# Patient Record
Sex: Female | Born: 1960 | Hispanic: Yes | Smoking: Never smoker
Health system: Southern US, Community
[De-identification: ages and names within clinical notes are randomized; demographics above are authoritative.]

## PROBLEM LIST (undated history)

## (undated) DIAGNOSIS — F329 Major depressive disorder, single episode, unspecified: Secondary | ICD-10-CM

## (undated) DIAGNOSIS — I10 Essential (primary) hypertension: Secondary | ICD-10-CM

## (undated) DIAGNOSIS — F32A Depression, unspecified: Secondary | ICD-10-CM

## (undated) DIAGNOSIS — T4145XA Adverse effect of unspecified anesthetic, initial encounter: Secondary | ICD-10-CM

## (undated) DIAGNOSIS — T8859XA Other complications of anesthesia, initial encounter: Secondary | ICD-10-CM

## (undated) DIAGNOSIS — N950 Postmenopausal bleeding: Secondary | ICD-10-CM

## (undated) DIAGNOSIS — K2970 Gastritis, unspecified, without bleeding: Secondary | ICD-10-CM

## (undated) HISTORY — DX: Gastritis, unspecified, without bleeding: K29.70

---
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
## 1998-10-31 ENCOUNTER — Emergency Department (HOSPITAL_COMMUNITY): Admission: EM | Admit: 1998-10-31 | Discharge: 1998-10-31 | Payer: Self-pay

## 1999-04-23 ENCOUNTER — Emergency Department (HOSPITAL_COMMUNITY): Admission: EM | Admit: 1999-04-23 | Discharge: 1999-04-23 | Payer: Self-pay | Admitting: Emergency Medicine

## 1999-05-03 ENCOUNTER — Emergency Department (HOSPITAL_COMMUNITY): Admission: EM | Admit: 1999-05-03 | Discharge: 1999-05-03 | Payer: Self-pay | Admitting: Emergency Medicine

## 1999-05-06 ENCOUNTER — Ambulatory Visit (HOSPITAL_COMMUNITY): Admission: RE | Admit: 1999-05-06 | Discharge: 1999-05-06 | Payer: Self-pay | Admitting: Emergency Medicine

## 1999-05-06 ENCOUNTER — Encounter: Payer: Self-pay | Admitting: Emergency Medicine

## 1999-05-19 ENCOUNTER — Ambulatory Visit (HOSPITAL_COMMUNITY): Admission: RE | Admit: 1999-05-19 | Discharge: 1999-05-19 | Payer: Self-pay | Admitting: Family Medicine

## 2001-01-02 ENCOUNTER — Emergency Department (HOSPITAL_COMMUNITY): Admission: EM | Admit: 2001-01-02 | Discharge: 2001-01-02 | Payer: Self-pay | Admitting: Emergency Medicine

## 2001-01-02 ENCOUNTER — Encounter: Payer: Self-pay | Admitting: Emergency Medicine

## 2004-01-24 ENCOUNTER — Emergency Department (HOSPITAL_COMMUNITY): Admission: EM | Admit: 2004-01-24 | Discharge: 2004-01-24 | Payer: Self-pay | Admitting: Emergency Medicine

## 2004-01-27 ENCOUNTER — Emergency Department (HOSPITAL_COMMUNITY): Admission: EM | Admit: 2004-01-27 | Discharge: 2004-01-27 | Payer: Self-pay | Admitting: Emergency Medicine

## 2010-02-19 ENCOUNTER — Ambulatory Visit (HOSPITAL_COMMUNITY): Admission: RE | Admit: 2010-02-19 | Discharge: 2010-02-19 | Payer: Self-pay | Admitting: Geriatric Medicine

## 2010-02-23 ENCOUNTER — Inpatient Hospital Stay (HOSPITAL_COMMUNITY): Admission: AD | Admit: 2010-02-23 | Discharge: 2010-02-23 | Payer: Self-pay | Admitting: Obstetrics and Gynecology

## 2011-02-14 ENCOUNTER — Emergency Department (HOSPITAL_COMMUNITY)
Admission: EM | Admit: 2011-02-14 | Discharge: 2011-02-15 | Disposition: A | Discharge status: Home / Self Care | Payer: No Typology Code available for payment source | Attending: Emergency Medicine | Admitting: Emergency Medicine

## 2011-02-14 ENCOUNTER — Other Ambulatory Visit (HOSPITAL_COMMUNITY): Payer: Self-pay | Admitting: Specialist

## 2011-02-14 DIAGNOSIS — M545 Low back pain, unspecified: Secondary | ICD-10-CM | POA: Insufficient documentation

## 2011-02-14 DIAGNOSIS — Z1231 Encounter for screening mammogram for malignant neoplasm of breast: Secondary | ICD-10-CM

## 2011-02-14 DIAGNOSIS — S8010XA Contusion of unspecified lower leg, initial encounter: Secondary | ICD-10-CM | POA: Insufficient documentation

## 2011-02-14 DIAGNOSIS — S335XXA Sprain of ligaments of lumbar spine, initial encounter: Secondary | ICD-10-CM | POA: Insufficient documentation

## 2011-02-14 DIAGNOSIS — M79609 Pain in unspecified limb: Secondary | ICD-10-CM | POA: Insufficient documentation

## 2011-02-15 ENCOUNTER — Emergency Department (HOSPITAL_COMMUNITY): Payer: No Typology Code available for payment source

## 2011-03-08 ENCOUNTER — Ambulatory Visit (HOSPITAL_COMMUNITY)
Admission: RE | Admit: 2011-03-08 | Discharge: 2011-03-08 | Disposition: A | Discharge status: Home / Self Care | Payer: Self-pay | Source: Ambulatory Visit | Attending: Specialist | Admitting: Specialist

## 2011-03-08 DIAGNOSIS — Z1231 Encounter for screening mammogram for malignant neoplasm of breast: Secondary | ICD-10-CM | POA: Insufficient documentation

## 2014-03-29 ENCOUNTER — Ambulatory Visit: Payer: Self-pay | Admitting: Internal Medicine

## 2014-03-29 VITALS — BP 100/80 | HR 82 | Temp 98.7°F | Resp 15 | Ht <= 58 in | Wt 150.0 lb

## 2014-03-29 DIAGNOSIS — R05 Cough: Secondary | ICD-10-CM

## 2014-03-29 DIAGNOSIS — R509 Fever, unspecified: Secondary | ICD-10-CM

## 2014-03-29 DIAGNOSIS — R059 Cough, unspecified: Secondary | ICD-10-CM

## 2014-03-29 DIAGNOSIS — Z833 Family history of diabetes mellitus: Secondary | ICD-10-CM

## 2014-03-29 LAB — POCT CBC
GRANULOCYTE PERCENT: 74 % (ref 37–80)
HCT, POC: 39.4 % (ref 37.7–47.9)
Hemoglobin: 12.4 g/dL (ref 12.2–16.2)
Lymph, poc: 1.4 (ref 0.6–3.4)
MCH, POC: 27.1 pg (ref 27–31.2)
MCHC: 31.5 g/dL — AB (ref 31.8–35.4)
MCV: 86.3 fL (ref 80–97)
MID (CBC): 0.5 (ref 0–0.9)
MPV: 11.2 fL (ref 0–99.8)
PLATELET COUNT, POC: 216 10*3/uL (ref 142–424)
POC GRANULOCYTE: 5.5 (ref 2–6.9)
POC LYMPH %: 19 % (ref 10–50)
POC MID %: 7 %M (ref 0–12)
RBC: 4.57 M/uL (ref 4.04–5.48)
RDW, POC: 14.4 %
WBC: 7.4 10*3/uL (ref 4.6–10.2)

## 2014-03-29 LAB — GLUCOSE, POCT (MANUAL RESULT ENTRY): POC GLUCOSE: 123 mg/dL — AB (ref 70–99)

## 2014-03-29 LAB — POCT GLYCOSYLATED HEMOGLOBIN (HGB A1C): HEMOGLOBIN A1C: 6

## 2014-03-29 MED ORDER — AZITHROMYCIN 250 MG PO TABS
ORAL_TABLET | ORAL | Status: DC
Start: 2014-03-29 — End: 2015-01-29

## 2014-03-29 MED ORDER — HYDROCODONE-HOMATROPINE 5-1.5 MG/5ML PO SYRP: 5.0000 mL | ORAL_SOLUTION | Freq: Four times a day (QID) | ORAL | Status: DC | PRN

## 2014-03-29 NOTE — Progress Notes (Signed)
Subjective:    Patient ID: Allison Hubbard, female    DOB: 12/26/1960, 53 y.o.   MRN: [id number]  This chart was scribed for Allison Siaobert Yarima Penman, MD by Bronson CurbJacqueline Melvin, ED Scribe. This patient was seen in room Room/bed 2 and the patient's care was started at 2:03 PM.   HPI  HPI Comments: Allison Hubbard is a 53 y.o. female who presents to the [facility] complaining of constant fever (temp at triage 98.7 F) onset 2 days ago. There is associated cough, congestion, rhinorrhea, right ear pain, sore throat, "pain in her left lung when breathing". Patient has family history of DM. She denies history of CAD.  Patient is requesting labs to be performed to r/o dm.  Review of Systems  Constitutional: Positive for fever.  HENT: Positive for congestion, ear pain, rhinorrhea and sore throat.   Respiratory: Positive for cough.   All other systems reviewed and are negative.      Objective:   Physical Exam  Nursing note and vitals reviewed. Constitutional: She is oriented to person, place, and time. She appears well-developed and well-nourished. No distress.  HENT:  Head: Normocephalic and atraumatic.  Right Ear: External ear normal.  Left Ear: External ear normal.  Nose: Nose normal.  Mouth/Throat: Oropharynx is clear and moist.  Eyes: Conjunctivae and EOM are normal. Pupils are equal, round, and reactive to light.  Neck: Neck supple.  Cardiovascular: Normal rate and regular rhythm.   Pulmonary/Chest: Effort normal. No respiratory distress.  Rhonchi bilaterally.  Musculoskeletal: Normal range of motion.  Lymphadenopathy:    She has no cervical adenopathy.  Neurological: She is alert and oriented to person, place, and time.  Skin: Skin is warm and dry.  Psychiatric: She has a normal mood and affect. Her behavior is normal.          Assessment & Plan:    I have completed the patient encounter in its entirety as documented by the scribe, with editing by  me where necessary. Mustapha Colson P. Merla Richesoolittle, M.D.  Cough--LRI-?mycoplasma  Fever, unspecified  Family history of diabetes mellitus (DM) - Plan: POCT CBC, POCT glycosylated hemoglobin (Hb A1C), POCT glucose (manual entry)  Results for orders placed in visit on 03/29/14  POCT CBC      Result Value Ref Range   WBC 7.4  4.6 - 10.2 K/uL   Lymph, poc 1.4  0.6 - 3.4   POC LYMPH PERCENT 19.0  10 - 50 %L   MID (cbc) 0.5  0 - 0.9   POC MID % 7.0  0 - 12 %M   POC Granulocyte 5.5  2 - 6.9   Granulocyte percent 74.0  37 - 80 %G   RBC 4.57  4.04 - 5.48 M/uL   Hemoglobin 12.4  12.2 - 16.2 g/dL   HCT, POC 40.939.4  81.137.7 - 47.9 %   MCV 86.3  80 - 97 fL   MCH, POC 27.1  27 - 31.2 pg   MCHC 31.5 (*) 31.8 - 35.4 g/dL   RDW, POC 91.414.4     Platelet Count, POC 216  142 - 424 K/uL   MPV 11.2  0 - 99.8 fL  POCT GLYCOSYLATED HEMOGLOBIN (HGB A1C)      Result Value Ref Range   Hemoglobin A1C 6.0    GLUCOSE, POCT (MANUAL RESULT ENTRY)      Result Value Ref Range   POC Glucose 123 (*) 70 - 99 mg/dl   Discussed wt loss  as way to avoid progression to DM Meds ordered this encounter  Medications  . HYDROcodone-homatropine (HYCODAN) 5-1.5 MG/5ML syrup    Sig: Take 5 mLs by mouth every 6 (six) hours as needed for cough.    Dispense:  120 mL    Refill:  0  . azithromycin (ZITHROMAX) 250 MG tablet    Sig: As packaged    Dispense:  6 tablet    Refill:  0   Advise followup at 104 for complete physical examination for age

## 2014-07-28 ENCOUNTER — Other Ambulatory Visit (HOSPITAL_COMMUNITY)
Admission: RE | Admit: 2014-07-28 | Discharge: 2014-07-28 | Disposition: A | Discharge status: Home / Self Care | Payer: No Typology Code available for payment source | Source: Ambulatory Visit | Attending: Gynecology | Admitting: Gynecology

## 2014-07-28 ENCOUNTER — Encounter: Payer: Self-pay | Admitting: Gynecology

## 2014-07-28 ENCOUNTER — Ambulatory Visit (INDEPENDENT_AMBULATORY_CARE_PROVIDER_SITE_OTHER): Payer: Self-pay | Admitting: Gynecology

## 2014-07-28 VITALS — BP 128/76 | Ht <= 58 in | Wt 141.0 lb

## 2014-07-28 DIAGNOSIS — N949 Unspecified condition associated with female genital organs and menstrual cycle: Secondary | ICD-10-CM

## 2014-07-28 DIAGNOSIS — N938 Other specified abnormal uterine and vaginal bleeding: Secondary | ICD-10-CM

## 2014-07-28 DIAGNOSIS — N951 Menopausal and female climacteric states: Secondary | ICD-10-CM

## 2014-07-28 DIAGNOSIS — N915 Oligomenorrhea, unspecified: Secondary | ICD-10-CM

## 2014-07-28 DIAGNOSIS — E663 Overweight: Secondary | ICD-10-CM

## 2014-07-28 DIAGNOSIS — Z124 Encounter for screening for malignant neoplasm of cervix: Secondary | ICD-10-CM

## 2014-07-28 DIAGNOSIS — Z01419 Encounter for gynecological examination (general) (routine) without abnormal findings: Secondary | ICD-10-CM | POA: Insufficient documentation

## 2014-07-28 DIAGNOSIS — Z1151 Encounter for screening for human papillomavirus (HPV): Secondary | ICD-10-CM | POA: Insufficient documentation

## 2014-07-28 MED ORDER — MEDROXYPROGESTERONE ACETATE 10 MG PO TABS: ORAL_TABLET | ORAL | Status: DC

## 2014-07-28 NOTE — Patient Instructions (Addendum)
Medroxyprogesterone tablets Qu es este medicamento? La MEDROXIPROGESTERONA es una hormona de la clase llamada progestinas. Se utiliza comnmente para Human resources officer excesivo del revestimiento uterino en las mujeres tomando un estrgeno despus de la menopausia. Adems se [place] para tratar el sangrado menstrual irregular o la ausencia de sangrado menstrual en mujeres. Este medicamento puede ser utilizado para otros usos; si tiene alguna pregunta consulte con su proveedor de atencin mdica o con su farmacutico. MARCAS COMERCIALES DISPONIBLES: Amen, Provera Qu le debo informar a mi profesional de la salud antes de tomar este medicamento? Necesita saber si usted presenta alguno de los siguientes problemas o situaciones: -enfermedad vascular o antecedente de cogulos sanguneos en los pulmones o las piernas -cncer de mama, cervical o vaginal -enfermedad cardiaca -enfermedad renal -enfermedad heptica -aborto espontneo o aborto reciente -depresin mental -migraa -convulsiones -derrame cerebral -sangrado vaginal que no ha sido evaluado -una Automotive engineer o inusual a la medroxiprogesterona, a otros medicamentos, alimentos, colorantes o conservantes -si est embarazada o buscando quedar embarazada -si est amamantando a un beb Cmo debo utilizar este medicamento? Tome este medicamento por va oral con un vaso de agua. Siga las instrucciones de la etiqueta del [place]. Tome sus dosis a intervalos regulares. No tome su medicamento con una frecuencia mayor a la indicada. Hable con su pediatra para informarse acerca del uso de este medicamento en nios. Puede requerir atencin especial. Aunque este medicamento ha sido recetado a nios tan menores como de 13 aos de edad para condiciones selectivas, las precauciones se aplican. Sobredosis: Pngase en contacto inmediatamente con un centro toxicolgico o una sala de urgencia si usted cree que haya tomado demasiado  medicamento. ATENCIN: [employer] es solo para usted. No comparta este medicamento con nadie. Qu sucede si me olvido de una dosis? Si olvida una dosis, tmela lo antes posible. Si es casi la hora de la prxima dosis, tome slo esa dosis. No tome dosis adicionales o dobles. Qu puede interactuar con este medicamento? -barbitricos para inducir el sueo o para tratar los convulsiones -bosentano -carbamazepina -fenitona -rifampicina -hierba de [place] Puede ser que esta lista no menciona todas las posibles interacciones. Informe a su profesional de [employer] de [employer] Inc productos a base de hierbas, medicamentos de [place] o suplementos nutritivos que est tomando. Si usted fuma, consume bebidas alcohlicas o si utiliza drogas ilegales, indqueselo tambin a su profesional de [employer]. Algunas sustancias pueden interactuar con su medicamento. A qu debo estar atento al usar PPL Corporation? Visite a su profesional de la salud para Insurance underwriter. Deber hacerse exmenes de las mamas y la pelvis en forma regular. Si piensa que est embarazada deje de tomar este medicamento de inmediato y comunquese con su mdico o con su profesional de Radiographer, therapeutic. Qu efectos secundarios puedo tener al [employer] este medicamento? Efectos secundarios que debe informar a su mdico o a Producer, television/film/video de la salud tan pronto como sea posible: -secreciones o sensibilidad de las mamas -cambios de estados de nimo o emociones, tal como depresin -cambios en la visin o el habla -dolor en el abdomen, pecho, entrepierna o piernas -dolor de cabeza severo -erupcin cutnea, picazn o urticarias -falta de aliento repentina -cansancio o debilidad inusual -color amarillento de ojos o piel Efectos secundarios que, por lo general, no requieren atencin mdica (debe informarlos a su mdico o a su profesional de la salud si persisten o si son molestos): -acn -cambios en el sangrado  vaginal -cambios en el deseo sexual -crecimiento  de vello en el rostro -retencin de lquidos e hinchazn -dolor de cabeza -Programme researcher, broadcasting/film/video -aumento o prdida de peso Puede ser que esta lista no menciona todos los posibles efectos secundarios. Comunquese a su mdico por asesoramiento mdico [employer]. Usted puede informar los efectos secundarios a la FDA por telfono al 1-800-FDA-1088. Dnde debo guardar mi medicina? Mantngala fuera del alcance de los nios. Gurdela a San[employer]I, entre 20 y 25 grados C (80 y 36 grados F). Deseche todo medicamento sin utilizar despus de su fecha de vencimiento. ATENCIN: Este folleto es un resumen. Puede ser que no cubra toda la posible informacin. Si usted tiene preguntas acerca de esta medicina, consulte con su mdico, su farmacutico o su profesional de Radiographer, therapeutic.  2015, Elsevier/Gold Standard. (2008-12-19 16:51:24) Biopsia de endometrio (Endometrial Biopsy) La biopsia de endometrio es un procedimiento en el que se toma una muestra de tejido del tero. Luego la muestra de tejido se observa en el microscopio para ver si el tejido es normal o anormal. El endometrio es el revestimiento interno del tero. Este procedimiento ayuda a determinar si est en el ciclo menstrual y de que modo los niveles de hormonas afectan el revestimiento del tero. Este procedimiento tambin se [place] para evaluar el sangrado uterino o para Arts administrator de endometrio, tuberculosis, plipos o enfermedades inflamatorias.  INFORME A SU MDICO:  Cualquier alergia que tenga.  Todos los [employer] [place], incluyendo vitaminas, hierbas, gotas oftlmicas, cremas y [address] de [address].  Problemas previos que usted o los [employer] de su familia hayan tenido con el uso de anestsicos.  Enfermedades de [employer].  Cirugas previas.  Padecimientos mdicos.  Posible embarazo. RIESGOS Y COMPLICACIONES Generalmente es un  procedimiento seguro. Sin embargo, Tree surgeon procedimiento, pueden surgir complicaciones. Las complicaciones posibles son:  Hemorragias.  Infecciones plvicas  Lesin en la pared del tero con el instrumento utilizado para tomar la biopsia (raro). ANTES DEL PROCEDIMIENTO   Lleve un registro de sus ciclos menstruales segn las indicaciones de su mdico. Puede ser necesario que programe el procedimiento para un momento especfico del ciclo menstrual.  Tendr que llevar un apsito sanitario para usar despus del procedimiento.  Pdale a alguna persona que la lleve a su casa despus del procedimiento si le dan un medicamento para relajarse (sedante). PROCEDIMIENTO   Le podrn administrar un medicamento para relajarse.  Deber recostarse en una camilla con los pies y las piernas elevados, como en el examen plvico.  El mdico insertar un instrumento (espculo) en la vagina para observar el cuello del tero.  El cuello del tero ser desinfectado con una solucin antisptica. Para adormecer el cuello del tero le aplicarn un medicamento (anestsico local).  Se utilizar un frceps (tenculo) para Radio producer KB Home	[place].  Se insertar un instrumento delgado, similar a una varilla (sonda uterina) a travs del cuello del tero para Chief Strategy Officer su longitud y la ubicacin en la que ser tomada la muestra para la biopsia.  Luego se pasa un tubo delgado y flexible (catter) a travs del cuello del tero hasta el tero. El catter se [place] para Building control surveyor de tejido del endometrio para la biopsia.  El catter y el espculo se retirarn y la [place] se enviar al laboratorio para ser examinada. DESPUS DEL PROCEDIMIENTO  Descansar en una sala de recuperacin hasta que est lista para volver a su casa.  Sentir clicos leves y tendr una pequea cantidad de sangrado vaginal durante algunos das despus del  procedimiento. Esto es normal.  Asegrese de [employer]on [employer]. Document Released: 07/03/2013 Document Revised: 11/05/2013 [facility] Patient Information 2015 [place], [place]. This information is not intended to replace advice given to you by your health care provider. Make sure you discuss any questions you have with your health care provider. Perimenopausia (Perimenopause) La perimenopausia es el momento en que su cuerpo comienza a pasar a la menopausia (sin menstruacin durante 12 meses consecutivos). Es un proceso natural. La perimenopausia puede comenzar entre 2 y 8 aos antes de la menopausia y por lo general tiene una duracin de 1 ao ms pasada la menopausia. [employer] Corporation, los ovarios podran producir un vulo o no. Los ovarios varan su produccin de las hormonas estrgeno y Psychiatric nurse. Esto puede causar perodos menstruales irregulares, dificultad para quedar embarazada, hemorragia vaginal entre perodos y sntomas incmodos. CAUSAS  Produccin irregular de las hormonas ovricas estrgeno y Education officer, museum, y no ovular todos los meses.  Otras causas son:  Tumor de la glndula pituitaria.  Enfermedades que [employer]ren Corporation ovarios.  Radioterapia.  Quimioterapia.  Causas desconocidas.  Fumar mucho y abusar del consumo de alcohol puede llevar a que la perimenopausia aparezca antes. SIGNOS Y SNTOMAS   Acaloramiento.  Sudoracin nocturna.  Perodos menstruales irregulares.  Disminucin del deseo sexual.  Sequedad vaginal.  Dolores de cabeza.  Cambios en el estado de nimo.  Depresin.  Problemas de memoria.  Irritabilidad.  Cansancio.  Aumento de [place].  Problemas para quedar embarazada.  Prdida de clulas seas (osteoporosis).  Comienzo de endurecimiento de las arterias (aterosclerosis). DIAGNSTICO  El mdico realizar un diagnstico en funcin de su edad, historial de perodos menstruales y sntomas. Le realizarn un examen fsico para ver si hay algn cambio en su cuerpo, en especial en sus  rganos reproductores. Las pruebas hormonales pueden ser o no tiles segn la cantidad de hormonas femeninas que produzca y [employer] produzca. Sin embargo, podrn Tree surgeon pruebas hormonales para Music therapist. TRATAMIENTO  En algunos casos, no se necesita tratamiento. La decisin acerca de qu tratamiento es necesario durante la perimenopausia deber realizarse en conjunto con su mdico segn cmo estn afectando los sntomas a su estilo de vida. Existen varios tratamientos disponibles, como:  Warehouse manager cada sntoma individual con medicamentos especficos para ese sntoma.  Algunos medicamentos herbales pueden ayudar en sntomas especficos.  Psicoterapia.  Terapia grupal. INSTRUCCIONES PARA EL CUIDADO EN EL HOGAR   Controle sus periodos menstruales (cundo ocurren, qu tan abundantes son, cunto tiempo pasa entre perodos, y cunto duran) como tambin sus sntomas y cundo comenzaron.  Tome slo medicamentos de venta libre o recetados, segn las indicaciones del mdico.  Duerma y descanse.  Haga actividad fsica.  Consuma una dieta que contenga calcio (bueno para los [place]) y productos derivados de la soja (actan como estrgenos).  No fume.  Evite las bebidas alcohlicas.  Tome los suplementos vitamnicos segn las indicaciones del mdico. En ciertos casos, puede ser de [place] tomar vitamina E.  Tome suplementos de calcio y vitamina D para ayudar a Research scientist (medical) prdida sea.  En algunos casos la terapia de grupo podr ayudarla.  La acupuntura puede ser de ayuda en ciertos casos. SOLICITE ATENCIN MDICA SI:   Tiene preguntas acerca de sus sntomas.  Necesita ser derivada a un especialista (gineclogo, psiquiatra, o psiclogo). SOLICITE ATENCIN MDICA DE INMEDIATO SI:   Sufre una hemorragia vaginal abundante.  Su perodo menstrual dura ms de 8 das.  Sus perodos son recurrentes cada menos de 21  das.  Tiene hemorragias durante las relaciones  sexuales.  Est muy deprimido.  Siente dolor al [employer].  Siente dolor de cabeza intenso.  Tiene problemas de visin. Document Released: 10/31/2005 Document Revised: 08/21/2013 [facility] Patient Information 2015 [place], [place]. This information is not intended to replace advice given to you by your health care provider. Make sure you discuss any questions you have with your health care provider.                                                   Control del colesterol  Los niveles de colesterol en el organismo estn determinados significativamente por su dieta. Los niveles de colesterol tambin se relacionan con la enfermedad cardaca. El material que sigue ayuda a Software engineer relacin y a Chiropractor qu puede hacer para mantener su corazn sano. No todo el colesterol es [place]. Las lipoprotenas de baja densidad (LDL) forman el colesterol "malo". El colesterol malo puede ocasionar depsitos de grasa que se acumulan en el interior de las arterias. Las lipoprotenas de alta densidad (HDL) es el colesterol "bueno". Ayuda a remover el colesterol LDL "malo" de la [place]. El colesterol es un factor de riesgo muy importante para la enfermedad cardaca. Otros factores de riesgo son la hipertensin arterial, el hbito de fumar, el estrs, la herencia y [place].   El msculo cardaco obtiene el suministro de sangre a travs de las arterias coronarias. Si su colesterol LDL ("malo") est elevado y el HDL ("bueno") es bajo, tiene un factor de riesgo para que se formen depsitos de [place] representative en las arterias coronarias (los vasos sanguneos que suministran sangre al corazn). Esto hace que haya menos lugar para que la sangre circule. Sin la suficiente sangre y oxgeno, el msculo cardaco no puede funcionar correctamente, y usted podr sentir dolores en el pecho (angina pectoris). Cuando una arteria coronaria se cierra completamente, una parte del msculo cardaco puede morir (infarto de miocardio).  CONTROL DEL  COLESTEROL Cuando el profesional que lo asiste enva la sangre al laboratorio para Artist nivel de colesterol, puede realizarle tambin un perfil completo de los lpidos. Con esta prueba, se puede determinar la cantidad total de colesterol, as como los niveles de LDL y HDL. Los triglicridos son un tipo de grasa que circula en la sangre y que tambin puede utilizarse para determinar el riesgo de enfermedad cardaca. En la siguiente tabla se establecen los nmeros ideales: Prueba: Colesterol total  Menos de 200 mg/dl.  Prueba: LDL "colesterol malo"  Menos de 100 mg/dl.   Menos de 70 mg/dl si tiene riesgo muy elevado de sufrir un ataque cardaco o muerte cardaca sbita.  Prueba: HDL "colesterol bueno"  Mujeres: Ms de 50 mg/dl.   Hombres: Ms de 40 mg/dl.  Prueba: Trigliceridos  Menos de 150 mg/dl.    CONTROL DEL COLESTEROL CON DIETA Aunque factores como el ejercicio y el estilo de vida son importantes, la "primera lnea de ataque" es la dieta. Esto se debe a que se sabe que ciertos alimentos hacen subir el colesterol y otros lo [place]. El objetivo debe ser [employer] Foods alimentos, de modo que tengan un efecto sobre el colesterol y, an ms importante, Microbiologist las grasas saturadas y trans con otros tipos de grasas, como las monoinsaturadas y las poliinsaturadas y cidos grasos omega-3 . En promedio, una persona no debe consumir ms  de 15 a 17 g de grasas saturadas por da. Las grasas saturadas y trans se consideran grasas "malas", ya que elevan el colesterol LDL. Las grasas saturadas se encuentran principalmente en productos animales como carne, [place] y crema. Pero esto no significa que usted Marketing executive todas sus comidas favoritas. Actualmente, como lo muestra el cuadro que figura al final de este documento, hay sustitutos de buen sabor, bajos en grasas y en colesterol, para la mayora de los alimentos que a usted Musician. Elija aquellos alimentos alternativos que sean  bajos en grasas o sin grasas. Elija cortes de carne del cuarto trasero o lomo ya que estos cortes son los que tienen menor cantidad de grasa y Oncologist. El pollo (sin piel), el pescado, la carne de ternera, y la [place] molida son excelentes opciones. Elimine las carnes Tyson Foods o el salami. Los [employer] o nada de grasas saturadas. Cuando consuma carne Tobias, carne de aves de corral, o pescado, hgalo en porciones de 85 gramos (3 onzas). Las grasas trans tambin se llaman "aceites parcialmente hidrogenados". Son aceites manipulados cientficamente de [place] que son slidos a Publishing rights manager, tienen una larga vida y Glass blower/designer sabor y la textura de los alimentos a los que se Scientist, clinical (histocompatibility and immunogenetics). Las grasas trans se encuentran en la [place], [place], crackers y alimentos horneados.  Para hornear y cocinar, el aceite es un excelente sustituto para la [place]. Los aceites monoinsaturados tienen un beneficio particular, ya que se cree que disminuyen el colesterol LDL (colesterol malo) y elevan el HDL. Deber evitar los aceites tropicales saturados como el de coco y el de [place].  Recuerde, adems, que puede comer sin restricciones los grupos de alimentos que son naturalmente libres de grasas saturadas y Neurosurgeon trans, entre los que se incluyen el pescado, las frutas (excepto el [place]), verduras, frijoles, cereales (cebada, arroz, [place], trigo) y las pastas (sin salsas con crema)   IDENTIFIQUE LOS ALIMENTOS QUE DISMINUYEN EL COLESTEROL  Pueden disminuir el colesterol las fibras solubles que estn en las frutas, como las [place], en los vegetales como el brcoli, las patatas y las zanahorias; en las legumbres como frijoles, guisantes y Therapist, occupational; y en los cereales como la cebada. Los alimentos fortificados con fitosteroles tambin Engineer, production. Debe consumir al menos 2 g de estos alimentos a diario para Financial planner de disminucin de [place].  En el  supermercado, lea las etiquetas de los envases para identificar los alimentos bajos en grasas saturadas, libres de grasas trans y bajos en [place], . Elija quesos que tengan solo de 2 a 3 g de grasa saturada por onza (28,35 g). Use una margarina que no dae el corazn, [place] de grasas trans o aceite parcialmente hidrogenado. Al comprar alimentos horneados (galletitas dulces y G[place]) evite el aceite parcialmente hidrogenado. Los panes y bollos debern ser de granos enteros (harina de maz o de avena entera, en lugar de "harina" o "harina enriquecida"). Compre sopas en lata que no sean cremosas, con bajo contenido de sal y sin grasas adicionadas.   TCNICAS DE PREPARACIN DE LOS ALIMENTOS  Nunca fra los alimentos en aceite abundante. Si debe frer, hgalo en poco aceite y removiendo Curtice, porque as se utilizan muy pocas grasas, o utilice un spray antiadherente. Cuando le sea posible, hierva, hornee o ase las carnes y cocine los vegetales al vapor. En vez de [employer] con mantequilla o [place], utilice limn y hierbas, pur de Psychologist, educational y canela (para las  calabazas y batatas), yogurt y salsa descremados y aderezos para ensaladas bajos en contenido graso.   BAJO EN GRASAS SATURADAS / SUSTITUTOS BAJOS EN GRASA  Carnes / Grasas saturadas (g)  Evite: Bife, corte graso (3 oz/85 g) / 11 g   Elija: Bife, corte magro (3 oz/85 g) / 4 g   Evite: [place]a (3 oz/85 g) / 7 g   Elija:  [place] magra (3 oz/85 g) / 5 g   Evite: Jamn (3 oz/85 g) / 6 g   Elija:  Jamn magro (3 oz/85 g) / 2.4 g   Evite: Pollo, con piel (3 oz/85 g), Carne oscura / 4 g   Elija:  Pollo, sin piel (3 oz/85 g), Carne oscura / 2 g   Evite: Pollo, con piel (3 oz/85 g), Carne magra / 2.5 g   Elija: Pollo, sin piel (3 oz/85 g), Carne magra / 1 g  Lcteos / Grasas saturadas (g)  Evite: Leche entera (1 taza) / 5 g   Elija: Leche con bajo contenido de grasa, 2% (1 taza) / 3 g   Elija: Leche con bajo  contenido de grasa, 1% (1 taza) / 1.5 g   Elija: Leche descremada (1 taza) / 0.3 g   Evite: Queso duro (1 oz/28 g) / 6 g   Elija: Queso descremado (1 oz/28 g) / 2-3 g   Evite: Queso cottage, 4% grasa (1 taza)/ 6.5 g   Elija: Queso cottage con bajo contenido de grasa, 1% grasa (1 taza)/ 1.5 g   Evite: Helado (1 taza) / 9 g   Elija: Sorbete (1 taza) / 2.5 g   Elija: Yogurt helado sin contenido de grasa (1 taza) / 0.3 g   Elija: Barras de fruta congeladas / vestigios   Evite: Crema batida (1 cucharada) / 3.5 g   Elija: Batidos glac sin lcteos (1 cucharada) / 1 g  Condimentos / Grasas saturadas (g)  Evite: Mayonesa (1 cucharada) / 2 g   Elija: Mayonesa con bajo contenido de grasa (1 cucharada) / 1 g   Evite: [place]eca (1 cucharada) / 7 g   Elija: Margarina extra light (1 cucharada) / 1 g   Evite: Aceite de coco (1 cucharada) / 11.8 g   Elija: Aceite de oliva (1 cucharada) / 1.8 g   Elija: Aceite de maz (1 cucharada) / 1.7 g   Elija: Aceite de crtamo (1 cucharada) / 1.2 g   Elija: Aceite de girasol (1 cucharada) / 1.4 g   Elija: Aceite de soja (1 cucharada) / 2.4 g   Elija: Aceite de canola (1 cucharada) / 1 g  Document Released: 10/31/2005 Document Revised: 07/13/2011 [facility] Patient Information 2012 [place], [place].

## 2014-07-28 NOTE — Progress Notes (Signed)
   The patient is a 53 year old gravida 1 para 1 (previous cesarean section) who presented to the office today as a new patient to the practice. She had been seen at the general medical clinic as a result of her 3 month history of no menses followed by 3 weeks of heavy bleeding. She brought blood work that was done at that facility to include the following:  FSH, LH, CBC, comprehensive metabolic panel, and TSH which were normal  Her hemoglobin A1c was found to be elevated with a value of 6.1 and her lipid profile demonstrated her total cholesterol elevated at 212 and her LDL elevated at 124. There was no prolactin level drawn.  The head just inform her to return back in 3 months a repeat her blood sugar test and to start a cholesterol lowering diet. Patient states that she is not sexually active. She has concerns about her weight gain. She denies any vasomotor symptoms. This is the first time that she has gone 3 months without a menstrual cycle.  Exam: Patient feet 9 inches tall 141 pounds BMI is 30.5 one  Abdomen: Soft nontender no rebound guarding Pelvic: Bartholin urethra Skene was within normal limits Vagina: Some menstrual blood was noted Cervix: No active bleeding no lesions seen. Uterus: Anteverted normal size shape and consistency Adnexa: A viable mask and Rectal exam: Not done  The patient was counseled and had an endometrial biopsy done in the following fashion: The cervix was cleansed with Betadine solution. A single-tooth tenaculum was placed on the anterior cervical lip. A sterile Pipelle was introduced into the uterine cavity. Uterus sounded approximately 8 cm. Moderate amount of tissue was obtained was submitted for histological evaluation. To this of note a Pap smear was done. Patient denied any prior history of any abnormal Pap smear and had not had a Pap smear or gynecological exam since 2004. She is also overdue for her mammogram.  Assessment/plan: Perimenopausal patient  with normal FSH with oligomenorrhea. Prolactin had not been tested by her physician we will check that today. We will await the results of the endometrial biopsy. She will be prescribed Provera to take 10 mg 1 by mouth daily every 35 days if she does not have a spontaneous menses. She was reminded to follow up with her PCP in 3 months as per the recommendation to rule out diabetes and to adhere to the dietary protocol that was provided by the end as well as in our office today. She was provided with literature and information for indigent patients to have mammograms here in our community. We discussed importance of monthly breast exams as well. She was not interested in the flu vaccine today. We will notify her if there is any abnormality on any of the above tests. If she continues to have dysfunctional uterine bleeding we will then proceed with a sonohysterogram. All the above was discussed in Spanish and will follow accordingly.

## 2014-07-29 LAB — PROLACTIN: Prolactin: 15.9 ng/mL

## 2014-07-29 LAB — CYTOLOGY - PAP

## 2014-09-15 ENCOUNTER — Encounter: Payer: Self-pay | Admitting: Gynecology

## 2014-10-27 ENCOUNTER — Ambulatory Visit: Payer: Self-pay | Admitting: Gynecology

## 2015-01-29 ENCOUNTER — Ambulatory Visit (INDEPENDENT_AMBULATORY_CARE_PROVIDER_SITE_OTHER): Payer: Self-pay | Admitting: Family Medicine

## 2015-01-29 VITALS — BP 110/70 | HR 97 | Temp 99.1°F | Resp 16 | Ht 59.0 in | Wt 159.0 lb

## 2015-01-29 DIAGNOSIS — J111 Influenza due to unidentified influenza virus with other respiratory manifestations: Secondary | ICD-10-CM

## 2015-01-29 DIAGNOSIS — R6889 Other general symptoms and signs: Secondary | ICD-10-CM

## 2015-01-29 LAB — POCT CBC
GRANULOCYTE PERCENT: 73 % (ref 37–80)
HCT, POC: 37.4 % — AB (ref 37.7–47.9)
Hemoglobin: 11.7 g/dL — AB (ref 12.2–16.2)
Lymph, poc: 2 (ref 0.6–3.4)
MCH, POC: 26.4 pg — AB (ref 27–31.2)
MCHC: 31.3 g/dL — AB (ref 31.8–35.4)
MCV: 84.5 fL (ref 80–97)
MID (cbc): 0.6 (ref 0–0.9)
MPV: 9 fL (ref 0–99.8)
POC Granulocyte: 7 — AB (ref 2–6.9)
POC LYMPH PERCENT: 21.1 %L (ref 10–50)
POC MID %: 5.9 %M (ref 0–12)
Platelet Count, POC: 166 10*3/uL (ref 142–424)
RBC: 4.42 M/uL (ref 4.04–5.48)
RDW, POC: 14.8 %
WBC: 9.6 10*3/uL (ref 4.6–10.2)

## 2015-01-29 MED ORDER — DM-GUAIFENESIN ER 30-600 MG PO TB12: 1.0000 | ORAL_TABLET | Freq: Two times a day (BID) | ORAL | Status: DC

## 2015-01-29 NOTE — Patient Instructions (Addendum)
1.  Recommend AFRIN nasal spray --- 2 sprays twice daily (you can buy at store).  Gripe (Influenza) La gripe es una infeccin viral del tracto respiratorio. Ocurre con ms frecuencia en los meses de invierno, ya que las personas pasan ms tiempo en contacto cercano. La gripe puede enfermarlo considerablemente. Se transmite fcilmente de [place] persona a otra (es contagiosa). CAUSAS  La causa es un virus que infecta el tracto respiratorio. Puede contagiarse el virus al aspirar las gotitas que una persona infectada elimina al toser o Engineering geologistestornudar. Tambin puede contagiarse al tocar algo que fue recientemente contaminado con el virus y Tenet Healthcareluego llevarse la mano a la boca, la nariz o los ojos. RIESGOS Y COMPLICACIONES Tendr mayor riesgo de sufrir un resfro grave si consume cigarrillos, es diabtico, sufre una enfermedad cardaca (como insuficiencia cardaca) o pulmonar crnica (como asma) o si tiene debilitado el sistema inmunolgico. Los ancianos y las mujeres embarazadas tienen ms riesgo de sufrir infecciones graves. El problema ms frecuente de la gripe es la infeccin pulmonar (neumona). En algunos casos, este problema puede requerir atencin mdica de emergencia y Biochemist, clinicalponer en peligro la vida. Marnee SpringSIGNOS Y SNTOMAS  Los sntomas pueden durar entre 4 y [address] das y pueden ser:  Grant RutsFiebre.  Escalofros.  Dolor de Turkmenistancabeza, dolores en el cuerpo y musculares.  Dolor de Advertising copywritergarganta.  Molestias en el pecho y tos.  Prdida del apetito.  Debilidad o cansancio.  Mareos.  Nuseas o vmitos. DIAGNSTICO  El diagnstico se realiza segn su historia clnica y un examen fsico. Es necesario realizar un anlisis de cultivo farngeo o nasal para confirmar el diagnstico. TRATAMIENTO  En los casos leves, la gripe se cura sin TEFL teachertratamiento. El tratamiento est dirigido a Consulting civil engineeraliviar los sntomas. En los casos ms graves, el mdico podr recetar medicamentos antivirales para acortar el curso de la enfermedad. Los antibiticos no  son eficaces, ya que la infeccin est causada por un virus y no una bacteria. INSTRUCCIONES PARA EL CUIDADO EN EL HOGAR  Tome los medicamentos solamente como se lo haya indicado el mdico.  Utilice un humidificador de niebla fra para facilitar la respiracin.  Haga reposo hasta que la temperatura vuelva a ser normal. Generalmente esto lleva entre 3 y [address] das.  Beba suficiente lquido para Photographermantener la orina clara o de color amarillo plido.  Cbrase la boca y la nariz al toser o Engineering geologistestornudar, y Allstatelvese las manos muy bien para evitar que se propague el virus.  Lanny HurstQudese en su casa y no concurra al Aleen Campitrabajo o a la escuela hasta que la fiebre haya desaparecido al menos por un da completo. PREVENCIN  La vacunacin anual contra la gripe es la mejor manera de evitar enfermarse. Se recomienda ahora de manera rutinaria una vacuna anual contra la gripe a todos los Lowe's Companiesadultos estadounidenses. SOLICITE ATENCIN MDICA SI:  Tiene dolor en el pecho, la tos empeora o tiene ms mucosidad.  Tiene nuseas, vmitos o diarrea.  La fiebre regresa o empeora. SOLICITE ATENCIN MDICA DE INMEDIATO SI:   Tiene dificultad para respirar, le falta el aire o tiene la piel o las uas Baxter Estatesazuladas.  Presenta dolor intenso o entumecimiento en el cuello.  Le duele la cabeza de forma repentina o tiene dolor en la cara o el odo.  Tiene nuseas o vmitos que no puede controlar. ASEGRESE DE QUE:   Comprende estas instrucciones.  Controlar su afeccin.  Recibir ayuda de inmediato si no mejora o si empeora. Document Released: 08/10/2005 Document Revised: 03/17/2014 ExitCare Patient Information 2015  [employer]. This information is not intended to replace advice given to you by your health care provider. Make sure you discuss any questions you have with your health care provider.

## 2015-01-29 NOTE — Progress Notes (Addendum)
Subjective:   This chart was scribed for Nilda Simmer, MD by Jarvis Morgan, ED Scribe. This patient was seen in Room 2 and the patient's care was started at 8:15 PM.    Patient ID: Allison Hubbard, female    DOB: [birth year], 54 y.o.   MRN: [id number]  01/29/2015  Sore Throat; Headache; and Fever   HPI  HPI Comments: Allison Hubbard is a 54 y.o. female who presents to the [facility] complaining of an intermittent moderate cough for 4 days. She has had an associated fever (t-max 103.5 F last night), chills, diaphoresis, generalized body aches, right otalgia and HA, sore throat. She denies any nausea, vomiting or diarrhea. No rash. No flu vaccine this season.  Daughter sick with similar symptoms.  Works in Stage manager.  Review of Systems  Constitutional: Positive for fever (t-max 103.5 F), chills, diaphoresis and fatigue.  HENT: Positive for congestion, ear pain (right), rhinorrhea and sore throat. Negative for sinus pressure.   Respiratory: Positive for cough. Negative for shortness of breath.   Gastrointestinal: Negative for nausea, vomiting, abdominal pain and diarrhea.  Musculoskeletal: Positive for myalgias.  Skin: Negative for rash.  Neurological: Positive for headaches.    History reviewed. No pertinent past medical history. Past Surgical History  Procedure Laterality Date  . Cesarean section     No Known Allergies      Objective:    Triage Vitals: BP 110/70 mmHg  Pulse 97  Temp(Src) 99.1 F (37.3 C) (Oral)  Resp 16  Ht  (1.499 m)  Wt 159 lb (72.122 kg)  BMI 32.10 kg/m2  SpO2 97%  Physical Exam  Constitutional: She is oriented to person, place, and time. She appears well-developed and well-nourished. No distress.  HENT:  Head: Normocephalic and atraumatic.  Right Ear: Tympanic membrane, external ear and ear canal normal.  Left Ear: Tympanic membrane, external ear and ear canal normal.  Nose: Nose normal.  Mouth/Throat:  Uvula is midline. Posterior oropharyngeal erythema (mild) present. No oropharyngeal exudate.  Eyes: Conjunctivae and EOM are normal. Pupils are equal, round, and reactive to light.  Neck: Neck supple. No thyromegaly present.  Cardiovascular: Normal rate, regular rhythm and normal heart sounds.   No murmur heard. Pulmonary/Chest: Effort normal and breath sounds normal. No respiratory distress. She has no wheezes. She has no rales.  Abdominal: Soft. Bowel sounds are normal. She exhibits no distension and no mass. There is no tenderness. There is no rebound and no guarding.  Musculoskeletal: Normal range of motion.  Lymphadenopathy:    She has no cervical adenopathy.  Neurological: She is alert and oriented to person, place, and time.  Skin: Skin is warm and dry. No rash noted. She is not diaphoretic.  Psychiatric: She has a normal mood and affect. Her behavior is normal.  Nursing note and vitals reviewed.  Results for orders placed or performed in visit on 01/29/15  POCT CBC  Result Value Ref Range   WBC 9.6 4.6 - 10.2 K/uL   Lymph, poc 2.0 0.6 - 3.4   POC LYMPH PERCENT 21.1 10 - 50 %L   MID (cbc) 0.6 0 - 0.9   POC MID % 5.9 0 - 12 %M   POC Granulocyte 7.0 (A) 2 - 6.9   Granulocyte percent 73.0 37 - 80 %G   RBC 4.42 4.04 - 5.48 M/uL   Hemoglobin 11.7 (A) 12.2 - 16.2 g/dL   HCT, POC 01.6 (A) 01.0 - 47.9 %  MCV 84.5 80 - 97 fL   MCH, POC 26.4 (A) 27 - 31.2 pg   MCHC 31.3 (A) 31.8 - 35.4 g/dL   RDW, POC 16.114.8 %   Platelet Count, POC 166 142 - 424 K/uL   MPV 9.0 0 - 99.8 fL       Assessment & Plan:   1. Flu-like symptoms   2. Influenza    -New. -Supportive care with Ibuprofen and Tylenol; recommend Afrin nasal spray bid; rx for Mucinex DM provided. -RTC for acute worsening.   -Low risk for complications and outside 48 hour time frame for Tamiflu; pt expressed understanding. -No work while febrile.   Meds ordered this encounter  Medications  . dextromethorphan-guaiFENesin  (MUCINEX DM) 30-600 MG per 12 hr tablet    Sig: Take 1 tablet by mouth 2 (two) times daily.    Dispense:  20 tablet    Refill:  0    No Follow-up on file.   I personally performed the services described in this documentation, which was scribed in my presence. The recorded information has been reviewed and considered.   Geraldyne Barraclough Paulita FujitaMartin Ladan Vanderzanden, M.D. [facility]  [place] [address] [place], [place]  [id number] [phone] phone [phone]) [phone] fax

## 2015-11-15 DIAGNOSIS — I10 Essential (primary) hypertension: Secondary | ICD-10-CM | POA: Insufficient documentation

## 2015-11-15 DIAGNOSIS — M549 Dorsalgia, unspecified: Secondary | ICD-10-CM | POA: Insufficient documentation

## 2015-11-15 DIAGNOSIS — J3489 Other specified disorders of nose and nasal sinuses: Secondary | ICD-10-CM | POA: Insufficient documentation

## 2015-11-15 DIAGNOSIS — R002 Palpitations: Secondary | ICD-10-CM | POA: Insufficient documentation

## 2015-11-15 DIAGNOSIS — R0789 Other chest pain: Secondary | ICD-10-CM | POA: Insufficient documentation

## 2015-11-15 DIAGNOSIS — R05 Cough: Secondary | ICD-10-CM | POA: Insufficient documentation

## 2015-11-15 DIAGNOSIS — Z79899 Other long term (current) drug therapy: Secondary | ICD-10-CM | POA: Insufficient documentation

## 2015-11-15 DIAGNOSIS — R0982 Postnasal drip: Secondary | ICD-10-CM | POA: Insufficient documentation

## 2015-11-15 DIAGNOSIS — R0602 Shortness of breath: Secondary | ICD-10-CM | POA: Insufficient documentation

## 2015-11-16 ENCOUNTER — Emergency Department (HOSPITAL_COMMUNITY)
Admission: EM | Admit: 2015-11-16 | Discharge: 2015-11-16 | Disposition: A | Discharge status: Home / Self Care | Payer: Self-pay | Attending: Emergency Medicine | Admitting: Emergency Medicine

## 2015-11-16 ENCOUNTER — Emergency Department (HOSPITAL_COMMUNITY): Payer: Self-pay

## 2015-11-16 ENCOUNTER — Other Ambulatory Visit: Payer: Self-pay

## 2015-11-16 ENCOUNTER — Encounter (HOSPITAL_COMMUNITY): Payer: Self-pay | Admitting: Emergency Medicine

## 2015-11-16 DIAGNOSIS — R0789 Other chest pain: Secondary | ICD-10-CM

## 2015-11-16 HISTORY — DX: Essential (primary) hypertension: I10

## 2015-11-16 LAB — CBC
HCT: 37.4 % (ref 36.0–46.0)
HEMOGLOBIN: 12.6 g/dL (ref 12.0–15.0)
MCH: 28.3 pg (ref 26.0–34.0)
MCHC: 33.7 g/dL (ref 30.0–36.0)
MCV: 84 fL (ref 78.0–100.0)
PLATELETS: 220 10*3/uL (ref 150–400)
RBC: 4.45 MIL/uL (ref 3.87–5.11)
RDW: 13 % (ref 11.5–15.5)
WBC: 7.8 10*3/uL (ref 4.0–10.5)

## 2015-11-16 LAB — BASIC METABOLIC PANEL
ANION GAP: 14 (ref 5–15)
BUN: 11 mg/dL (ref 6–20)
CALCIUM: 9.9 mg/dL (ref 8.9–10.3)
CO2: 22 mmol/L (ref 22–32)
CREATININE: 0.79 mg/dL (ref 0.44–1.00)
Chloride: 103 mmol/L (ref 101–111)
Glucose, Bld: 188 mg/dL — ABNORMAL HIGH (ref 65–99)
Potassium: 3.6 mmol/L (ref 3.5–5.1)
SODIUM: 139 mmol/L (ref 135–145)

## 2015-11-16 LAB — I-STAT TROPONIN, ED
TROPONIN I, POC: 0 ng/mL (ref 0.00–0.08)
TROPONIN I, POC: 0 ng/mL (ref 0.00–0.08)

## 2015-11-16 LAB — D-DIMER, QUANTITATIVE (NOT AT ARMC): D DIMER QUANT: 0.27 ug{FEU}/mL (ref 0.00–0.50)

## 2015-11-16 MED ORDER — METHOCARBAMOL 500 MG PO TABS
1000.0000 mg | ORAL_TABLET | Freq: Once | ORAL | Status: AC
  Administered 2015-11-16: 1000 mg via ORAL
  Filled 2015-11-16: qty 2

## 2015-11-16 MED ORDER — KETOROLAC TROMETHAMINE 60 MG/2ML IM SOLN
60.0000 mg | Freq: Once | INTRAMUSCULAR | Status: AC
  Administered 2015-11-16: 60 mg via INTRAMUSCULAR
  Filled 2015-11-16: qty 2

## 2015-11-16 MED ORDER — IBUPROFEN 600 MG PO TABS: 600.0000 mg | ORAL_TABLET | Freq: Three times a day (TID) | ORAL | Status: DC | PRN

## 2015-11-16 MED ORDER — METHOCARBAMOL 500 MG PO TABS: 1000.0000 mg | ORAL_TABLET | Freq: Three times a day (TID) | ORAL | Status: DC | PRN

## 2015-11-16 NOTE — Discharge Instructions (Signed)
Dolor en la pared torácica °(Chest Wall Pain) °El dolor en la pared torácica se produce en los huesos y los músculos del pecho o alrededor de estos. A veces, una lesión causa este dolor. En ocasiones, la causa puede ser desconocida. Este dolor puede durar varias semanas. °CUIDADOS EN EL HOGAR °Esté atento a cualquier cambio en los síntomas. Tome estas medidas para aliviar el dolor: °· Haga reposo tal como le indicó el médico. °· Evite las actividades que causan dolor. Intente no usar los músculos del tórax, los del vientre (abdominales) o los laterales para levantar objetos pesados. °· Si se lo indican, aplique hielo sobre la zona dolorida: °¨ Ponga el hielo en una bolsa plástica. °¨ Coloque una toalla entre la piel y la bolsa de hielo. °¨ Coloque el hielo durante 20 minutos, 2 a 3 veces por día. °· Tome los medicamentos de venta libre y los recetados solamente como se lo haya indicado el médico. °· No consuma productos que contengan tabaco, incluidos cigarrillos, tabaco de mascar y cigarrillos electrónicos. Si necesita ayuda para dejar de fumar, consulte al médico. °· Concurra a todas las visitas de control como se lo haya indicado el médico. Esto es importante. °SOLICITE AYUDA SI: °· Tiene fiebre. °· El dolor en el pecho empeora. °· Aparecen nuevos síntomas. °SOLICITE AYUDA DE INMEDIATO SI: °· Tiene malestar estomacal (náuseas) o vomita. °· Transpira o tiene sensación de desvanecimiento. °· Tiene tos con flema (esputo) o expectora sangre al toser. °· Comienza a sentir falta de aire. °  °Esta información no tiene como fin reemplazar el consejo del médico. Asegúrese de hacerle al médico cualquier pregunta que tenga. °  °Document Released: 10/20/2011 Document Revised: 07/22/2015 °Elsevier Interactive Patient Education ©2016 Elsevier Inc. ° °

## 2015-11-16 NOTE — ED Notes (Signed)
Pt departed with family and in NAD.  

## 2015-11-16 NOTE — ED Notes (Signed)
Pt. reports left chest pain with SOB and nausea onset this evening , denies emesis or diaphoresis .

## 2015-11-16 NOTE — ED Provider Notes (Signed)
CSN: [id number]     Arrival date & time 11/15/15  2358 History  By signing my name below, I, Allison Hubbard, attest that this documentation has been prepared under the direction and in the presence of Loren Raceravid Dominie Benedick, MD. Electronically Signed: Doreatha MartinEva Hubbard, ED Scribe. 11/16/2015. 2:06 AM.    Chief Complaint  Patient presents with  . Chest Pain   The history is provided by the patient. No language interpreter was used.    HPI Comments: Allison Hubbard is a 55 y.o. female with h/o HTN who presents to the Emergency Department complaining of moderate, improving left upper chest pain onset 3 hours ago. Per daughter, they were coming to the hospital for SOB and her CP began en route. Her SOB began 4.5 hours ago after gargling to alleviate a sensation of throat tightness and coughing. Pt states she took Ibuprofen with moderate relief of CP. Pt also complains of back pain, post-nasal drip, rhinorrhea, a sensation of palpitations after exertion for 3 days. No recent long travel or immobilization. She denies nausea, emesis, leg swelling or pain.   Past Medical History  Diagnosis Date  . Hypertension    Past Surgical History  Procedure Laterality Date  . Cesarean section     Family History  Problem Relation Age of Onset  . Diabetes Mother   . Diabetes Sister    Social History  Substance Use Topics  . Smoking status: Never Smoker   . Smokeless tobacco: Never Used  . Alcohol Use: Yes     Comment: RARE   OB History    Gravida Para Term Preterm AB TAB SAB Ectopic Multiple Living   1 1        1      Review of Systems  Constitutional: Negative for fever and chills.  HENT: Positive for congestion, postnasal drip and rhinorrhea. Negative for sore throat.   Respiratory: Positive for cough and shortness of breath. Negative for wheezing.   Cardiovascular: Positive for chest pain and palpitations. Negative for leg swelling.  Gastrointestinal: Negative for nausea, vomiting, abdominal pain, diarrhea  and constipation.  Musculoskeletal: Positive for myalgias and back pain. Negative for arthralgias, neck pain and neck stiffness.  Skin: Negative for rash and wound.  Neurological: Negative for dizziness, weakness, light-headedness, numbness and headaches.  All other systems reviewed and are negative.  Allergies  Review of patient's allergies indicates no known allergies.  Home Medications   Prior to Admission medications   Medication Sig Start Date End Date Taking? Authorizing Provider  Ascorbic Acid (VITAMIN C) 100 MG tablet Take 100 mg by mouth daily.   Yes Historical Provider, MD  Omega-3 Fatty Acids (FISH OIL) 1000 MG CAPS Take 1 capsule by mouth daily.    Yes Historical Provider, MD  dextromethorphan-guaiFENesin (MUCINEX DM) 30-600 MG per 12 hr tablet Take 1 tablet by mouth 2 (two) times daily. Patient not taking: Reported on 11/16/2015 01/29/15   Ethelda ChickKristi M Smith, MD  ibuprofen (ADVIL,MOTRIN) 600 MG tablet Take 1 tablet (600 mg total) by mouth every 8 (eight) hours as needed for moderate pain. 11/16/15   Loren Raceravid Marishka Rentfrow, MD  medroxyPROGESTERone (PROVERA) 10 MG tablet Take one tablet daily for ten days if no spontaneous menses wvwey 35 days Patient not taking: Reported on 11/16/2015 07/28/14   Ok EdwardsJuan H Fernandez, MD  methocarbamol (ROBAXIN) 500 MG tablet Take 2 tablets (1,000 mg total) by mouth every 8 (eight) hours as needed for muscle spasms. 11/16/15   Loren Raceravid Mirela Parsley, MD   BP 125/76  mmHg  Pulse 76  Temp(Src) 97.9 F (36.6 C) (Oral)  Resp 18  SpO2 98% Physical Exam  Constitutional: She is oriented to person, place, and time. She appears well-developed and well-nourished. No distress.  HENT:  Head: Normocephalic and atraumatic.  Mouth/Throat: Oropharynx is clear and moist.  Bilateral nasal mucosal edema. No sinus tenderness with percussion.  Eyes: EOM are normal. Pupils are equal, round, and reactive to light.  Neck: Normal range of motion. Neck supple.  Cardiovascular: Normal rate and  regular rhythm.   Pulmonary/Chest: Effort normal and breath sounds normal. No respiratory distress. She has no wheezes. She has no rales. She exhibits tenderness (chest tenderness is reproduced with palpation of the left upper chest. No crepitus or deformity.).  Abdominal: Soft. Bowel sounds are normal. She exhibits no distension and no mass. There is no tenderness. There is no rebound and no guarding.  Musculoskeletal: Normal range of motion. She exhibits no edema or tenderness.  Patient with diffuse left sided thoracic tenderness. No midline thoracic or lumbar tenderness. No evidence of trauma or deformity.  Neurological: She is alert and oriented to person, place, and time.  Patient is alert and oriented x3 with clear, goal oriented speech. Patient has 5/5 motor in all extremities. Sensation is intact to light touch.   Skin: Skin is warm and dry. No rash noted. No erythema.  Psychiatric: She has a normal mood and affect. Her behavior is normal.  Nursing note and vitals reviewed.   ED Course  Procedures (including critical care time) DIAGNOSTIC STUDIES: Oxygen Saturation is 99% on RA, normal by my interpretation.    COORDINATION OF CARE: 2:04 AM Discussed treatment plan with pt at bedside and pt agreed to plan.   Labs Review Labs Reviewed  BASIC METABOLIC PANEL - Abnormal; Notable for the following:    Glucose, Bld 188 (*)    All other components within normal limits  CBC  D-DIMER, QUANTITATIVE (NOT AT [facility])  Rosezena Sensor, ED  Rosezena Sensor, ED    Imaging Review Dg Chest 2 View  11/16/2015  CLINICAL DATA:  Chest pain and shortness of breath for 1 day. EXAM: CHEST  2 VIEW COMPARISON:  None. FINDINGS: Heart is at the upper limits normal in size, there is tortuosity of the descending thoracic aorta. The lungs are clear. Pulmonary vasculature is normal. No consolidation, pleural effusion, or pneumothorax. No acute osseous abnormalities are seen. IMPRESSION: No acute pulmonary  process. Electronically Signed   By: Rubye Oaks M.D.   On: 11/16/2015 00:58   I have personally reviewed and evaluated these images and lab results as part of my medical decision-making.   EKG Interpretation   Date/Time:  Monday November 16 2015 00:04:13 EST Ventricular Rate:  71 PR Interval:  154 QRS Duration: 70 QT Interval:  386 QTC Calculation: 419 R Axis:   -50 Text Interpretation:  Normal sinus rhythm Left axis deviation Abnormal ECG  Confirmed by Ranae Palms  MD, Nichole Keltner ([id number]) on 11/16/2015 1:31:13 AM      MDM   Final diagnoses:  Atypical chest pain   Patient presents with atypical chest pain. Reproduced on exam. EKG without any evidence of ischemia. Troponin and d-dimer are both normal. We'll treat for musculoskeletal pain.  Repeat troponin is normal. Pain is improved. We'll discharge home with NSAID and muscle relaxants. Patient been advised to follow-up with cardiology and return precautions have been given.  Loren Racer, MD 11/16/15 [phone]

## 2015-11-30 NOTE — Progress Notes (Signed)
Referring: Loren Raceravid [place]  HPI: 55 year old female for evaluation of chest pain. Pt does not speak english; history obtained with help of interpreter- Maretta LosBlanca Lindner. Patient seen in the emergency room on January 2 with chest pain. Troponin negative, d-dimer normal, hemoglobin 12.6 and chest x-ray with no infiltrate. Patient states that she will typically have some throat irritation and inflammation every winter. She developed throat irritation on the day she was evaluated in the emergency room. She then developed difficulty breathing followed by some chest discomfort that increased with cough. The pain did not radiate. No associated nausea but she was short of breath and diaphoretic. She typically does not have significant dyspnea on exertion, orthopnea, PND, pedal edema, palpitations, syncope or exertional chest pain. Cause of the above we were asked to evaluate.  Current Outpatient Prescriptions  Medication Sig Dispense Refill  . Ascorbic Acid (VITAMIN C) 100 MG tablet Take 100 mg by mouth daily.    Marland Kitchen. dextromethorphan-guaiFENesin (MUCINEX DM) 30-600 MG per 12 hr tablet Take 1 tablet by mouth 2 (two) times daily. (Patient taking differently: Take 1 tablet by mouth 2 (two) times daily as needed. ) 20 tablet 0  . ibuprofen (ADVIL,MOTRIN) 600 MG tablet Take 1 tablet (600 mg total) by mouth every 8 (eight) hours as needed for moderate pain. 30 tablet 0  . methocarbamol (ROBAXIN) 500 MG tablet Take 2 tablets (1,000 mg total) by mouth every 8 (eight) hours as needed for muscle spasms. 30 tablet 0  . Omega-3 Fatty Acids (FISH OIL) 1000 MG CAPS Take 1 capsule by mouth daily.     . promethazine-codeine (PHENERGAN WITH CODEINE) 6.25-10 MG/5ML syrup Take 5 mLs by mouth every 6 (six) hours.  0   No current facility-administered medications for this visit.    No Known Allergies   Past Medical History  Diagnosis Date  . Hypertension   . Gastritis     Past Surgical History  Procedure Laterality  Date  . Cesarean section      Social History   Social History  . Marital Status: Single    Spouse Name: N/A  . Number of Children: 1  . Years of Education: N/A   Occupational History  .      Cleaning   Social History Main Topics  . Smoking status: Former Games developermoker  . Smokeless tobacco: Never Used  . Alcohol Use: 0.0 oz/week    0 Standard drinks or equivalent per week     Comment: RARE  . Drug Use: No  . Sexual Activity: No   Other Topics Concern  . Not on file   Social History Narrative    Family History  Problem Relation Age of Onset  . Diabetes Mother   . Diabetes Sister     ROS: Recent throat irritation andl eg cramps but no fevers or chills, productive cough, hemoptysis, dysphasia, odynophagia, melena, hematochezia, dysuria, hematuria, rash, seizure activity, orthopnea, PND, pedal edema, claudication. Remaining systems are negative.  Physical Exam:   Blood pressure 130/94, pulse 73, height 4\' 11"  (1.499 m), weight 159 lb 14.4 oz (72.53 kg).  General:  Well developed/well nourished in NAD Skin warm/dry Patient not depressed No peripheral clubbing Back-normal HEENT-normal/normal eyelids Neck supple/normal carotid upstroke bilaterally; no bruits; no JVD; no thyromegaly chest - CTA/ normal expansion CV - RRR/normal S1 and S2; no murmurs, rubs or gallops;  PMI nondisplaced Abdomen -NT/ND, no HSM, no mass, + bowel sounds, no bruit 2+ femoral pulses, no bruits Ext-no edema, chords, 2+ DP Neuro-grossly  nonfocal  ECG 11/16/2015-sinus rhythm, left axis deviation, nonspecific ST changes.

## 2015-12-01 ENCOUNTER — Encounter: Payer: Self-pay | Admitting: Cardiology

## 2015-12-01 ENCOUNTER — Ambulatory Visit (INDEPENDENT_AMBULATORY_CARE_PROVIDER_SITE_OTHER): Payer: Self-pay | Admitting: Cardiology

## 2015-12-01 VITALS — BP 130/94 | HR 73 | Ht 59.0 in | Wt 159.9 lb

## 2015-12-01 DIAGNOSIS — R072 Precordial pain: Secondary | ICD-10-CM

## 2015-12-01 DIAGNOSIS — R079 Chest pain, unspecified: Secondary | ICD-10-CM | POA: Insufficient documentation

## 2015-12-01 DIAGNOSIS — I10 Essential (primary) hypertension: Secondary | ICD-10-CM | POA: Insufficient documentation

## 2015-12-01 NOTE — Assessment & Plan Note (Signed)
Diastolic blood pressure is minimally elevated. This will be tracked and she will follow-up with her primary care physician for further management.

## 2015-12-01 NOTE — Assessment & Plan Note (Signed)
Symptoms are extremely atypical. They occurred in the setting of a sore throat and increased with cough. Troponin normal. Electrocardiogram with no ST changes. She does not have exertional chest pain. I do not think further cardiac workup is indicated. Her symptoms were likely musculoskeletal.

## 2015-12-01 NOTE — Patient Instructions (Signed)
Your physician recommends that you schedule a follow-up appointment in: as needed  

## 2017-02-04 LAB — GLUCOSE, POCT (MANUAL RESULT ENTRY): POC Glucose: 169 mg/dl — AB (ref 70–99)

## 2017-03-29 ENCOUNTER — Encounter: Payer: Self-pay | Admitting: Gynecology

## 2017-06-16 ENCOUNTER — Encounter (HOSPITAL_COMMUNITY): Payer: Self-pay | Admitting: *Deleted

## 2017-06-16 ENCOUNTER — Emergency Department (HOSPITAL_COMMUNITY)
Admission: EM | Admit: 2017-06-16 | Discharge: 2017-06-16 | Disposition: A | Discharge status: Home / Self Care | Payer: Self-pay | Attending: Emergency Medicine | Admitting: Emergency Medicine

## 2017-06-16 ENCOUNTER — Emergency Department (HOSPITAL_COMMUNITY): Payer: Self-pay

## 2017-06-16 DIAGNOSIS — Z87891 Personal history of nicotine dependence: Secondary | ICD-10-CM | POA: Insufficient documentation

## 2017-06-16 DIAGNOSIS — Z79899 Other long term (current) drug therapy: Secondary | ICD-10-CM | POA: Insufficient documentation

## 2017-06-16 DIAGNOSIS — I10 Essential (primary) hypertension: Secondary | ICD-10-CM | POA: Insufficient documentation

## 2017-06-16 DIAGNOSIS — R072 Precordial pain: Secondary | ICD-10-CM | POA: Insufficient documentation

## 2017-06-16 LAB — CBC WITH DIFFERENTIAL/PLATELET
Basophils Absolute: 0 10*3/uL (ref 0.0–0.1)
Basophils Relative: 0 %
EOS ABS: 0.1 10*3/uL (ref 0.0–0.7)
EOS PCT: 1 %
HCT: 37.8 % (ref 36.0–46.0)
Hemoglobin: 12.7 g/dL (ref 12.0–15.0)
LYMPHS ABS: 2.2 10*3/uL (ref 0.7–4.0)
Lymphocytes Relative: 33 %
MCH: 28.4 pg (ref 26.0–34.0)
MCHC: 33.6 g/dL (ref 30.0–36.0)
MCV: 84.6 fL (ref 78.0–100.0)
MONOS PCT: 7 %
Monocytes Absolute: 0.5 10*3/uL (ref 0.1–1.0)
Neutro Abs: 3.8 10*3/uL (ref 1.7–7.7)
Neutrophils Relative %: 59 %
PLATELETS: 195 10*3/uL (ref 150–400)
RBC: 4.47 MIL/uL (ref 3.87–5.11)
RDW: 13.2 % (ref 11.5–15.5)
WBC: 6.6 10*3/uL (ref 4.0–10.5)

## 2017-06-16 LAB — I-STAT TROPONIN, ED: TROPONIN I, POC: 0 ng/mL (ref 0.00–0.08)

## 2017-06-16 LAB — BASIC METABOLIC PANEL
Anion gap: 9 (ref 5–15)
BUN: 14 mg/dL (ref 6–20)
CHLORIDE: 108 mmol/L (ref 101–111)
CO2: 22 mmol/L (ref 22–32)
CREATININE: 0.67 mg/dL (ref 0.44–1.00)
Calcium: 9.4 mg/dL (ref 8.9–10.3)
GFR calc Af Amer: >60 mL/min (ref >60)
GFR calc non Af Amer: >60 mL/min (ref >60)
Glucose, Bld: 115 mg/dL — ABNORMAL HIGH (ref 65–99)
Potassium: 3.8 mmol/L (ref 3.5–5.1)
Sodium: 139 mmol/L (ref 135–145)

## 2017-06-16 MED ORDER — ASPIRIN 81 MG PO CHEW
324.0000 mg | CHEWABLE_TABLET | Freq: Once | ORAL | Status: AC
  Administered 2017-06-16: 324 mg via ORAL
  Filled 2017-06-16: qty 4

## 2017-06-16 MED ORDER — IBUPROFEN 800 MG PO TABS: 800.0000 mg | ORAL_TABLET | Freq: Three times a day (TID) | ORAL | 0 refills | Status: DC | PRN

## 2017-06-16 MED FILL — IBUPROFEN 800 MG TAB: 800 | 7 days supply | Qty: 21 | Fill #0

## 2017-06-16 NOTE — Discharge Instructions (Signed)

## 2017-06-16 NOTE — ED Provider Notes (Signed)
[facility]EMER[facility]ENCY DEPT Provider Note   CSN: [id number] Arrival date & time: 06/16/17  [id number]     History   Chief Complaint Chief Complaint  Patient presents with  . Chest Pain    HPI Allison Hubbard is a 56 y.o. female.  Patient with history of Gastritis and Hypertension presenting with Chest Pain. The pain started yesterday afternoon and was slight pain that was relieved with, tea and lemon as well as a natural product she takes. This morning the pain became worse and was described as a waxing and waning 5-8/10 chest pain at her left upper chest, which radiates to her back when it flairs. The pain is worse with activity and relieved by rest. She has never had this pain bedfore.The her chest is tender to palpation and the pain is reproduced when she inhales deeply. She denies recent cough/illness, SOB, trauma.      Past Medical History:  Diagnosis Date  . Gastritis   . Hypertension     Patient Active Problem List   Diagnosis Date Noted  . Essential hypertension 12/01/2015  . Chest pain 12/01/2015  . Perimenopause 07/28/2014    Past Surgical History:  Procedure Laterality Date  . CESAREAN SECTION      OB History    Gravida Para Term Preterm AB Living   1 1       1    SAB TAB Ectopic Multiple Live Births                   Home Medications    Prior to Admission medications   Medication Sig Start Date End Date Taking? Authorizing Provider  Ascorbic Acid (VITAMIN C) 100 MG tablet Take 100 mg by mouth daily.    [provider]  dextromethorphan-guaiFENesin (MUCINEX DM) 30-600 MG per 12 hr tablet Take 1 tablet by mouth 2 (two) times daily. Patient taking differently: Take 1 tablet by mouth 2 (two) times daily as needed.  01/29/15   Ethelda ChickSmith, Kristi M, MD  ibuprofen (ADVIL,MOTRIN) 800 MG tablet Take 1 tablet (800 mg total) by mouth every 8 (eight) hours as needed. 06/16/17   Long, Arlyss RepressJoshua G, MD  methocarbamol (ROBAXIN) 500 MG tablet Take 2 tablets (1,000 mg total) by  mouth every 8 (eight) hours as needed for muscle spasms. 11/16/15   Loren RacerYelverton, David, MD  Omega-3 Fatty Acids (FISH OIL) 1000 MG CAPS Take 1 capsule by mouth daily.     [provider]  promethazine-codeine (PHENERGAN WITH CODEINE) 6.25-10 MG/5ML syrup Take 5 mLs by mouth every 6 (six) hours. 11/19/15   [provider]    Family History Family History  Problem Relation Age of Onset  . Diabetes Mother   . Diabetes Sister     Social History Social History  Substance Use Topics  . Smoking status: Former Games developermoker  . Smokeless tobacco: Never Used  . Alcohol use 0.0 oz/week     Comment: RARE     Allergies   Patient has no known allergies.   Review of Systems Review of Systems  Constitutional: Negative for chills and fever.  HENT: Negative for ear pain and sore throat.   Eyes: Negative for pain and visual disturbance.  Respiratory: Negative for cough and shortness of breath.   Cardiovascular: Positive for chest pain. Negative for palpitations.  Gastrointestinal: Negative for abdominal pain, nausea and vomiting.  Genitourinary: Negative for dysuria and hematuria.  Musculoskeletal: Negative for arthralgias and back pain.  Skin: Negative for color change and rash.  Neurological: Negative for seizures and syncope.  All other systems reviewed and are negative.    Physical Exam Updated Vital Signs BP 140/85   Pulse (!) 45   Temp 98.3 F (36.8 C) (Oral)   Resp 17   SpO2 99%   Physical Exam  Constitutional: She is oriented to person, place, and time. She appears well-developed and well-nourished. No distress.  HENT:  Head: Normocephalic and atraumatic.  Eyes: Conjunctivae are normal.  Neck: Neck supple.  Cardiovascular: Normal rate and regular rhythm.   No murmur heard. Pulmonary/Chest: Effort normal and breath sounds normal. No respiratory distress.  Abdominal: Soft. Bowel sounds are normal. There is no tenderness.  Musculoskeletal: She exhibits no edema or  deformity.  Left Chest wall tenderness  Neurological: She is alert and oriented to person, place, and time.  Skin: Skin is warm and dry.  Psychiatric: She has a normal mood and affect.  Nursing note and vitals reviewed.    ED Treatments / Results  Labs (all labs ordered are listed, but only abnormal results are displayed) Labs Reviewed  BASIC METABOLIC PANEL - Abnormal; Notable for the following:       Result Value   Glucose, Bld 115 (*)    All other components within normal limits  CBC WITH DIFFERENTIAL/PLATELET  I-STAT TROPONIN, ED    EKG  EKG Interpretation  Date/Time:  Friday June 16 2017 08:19:47 EDT Ventricular Rate:  51 PR Interval:    QRS Duration: 106 QT Interval:  470 QTC Calculation: 433 R Axis:   -15 Text Interpretation:  Sinus rhythm Borderline left axis deviation Abnormal R-wave progression, early transition No STEMI.  Confirmed by Alona BeneLong, Joshua [phone] on 06/16/2017 8:25:07 AM       Radiology Dg Chest 2 View  Result Date: 06/16/2017 CLINICAL DATA:  Left-sided chest pain beginning yesterday. EXAM: CHEST  2 VIEW COMPARISON:  11/16/2015 FINDINGS: The heart size and mediastinal contours are within normal limits. Both lungs are clear. The visualized skeletal structures are unremarkable. IMPRESSION: No active cardiopulmonary disease. Electronically Signed   By: Paulina FusiMark  Shogry M.D.   On: 06/16/2017 09:28    Procedures Procedures (including critical care time)  Medications Ordered in ED Medications  aspirin chewable tablet 324 mg (324 mg Oral Given 06/16/17 0931)     Initial Impression / Assessment and Plan / ED Course  I have reviewed the triage vital signs and the nursing notes.  Pertinent labs & imaging results that were available during my care of the patient were reviewed by me and considered in my medical decision making (see chart for details).     Patient presenting with chest pain worse with exersion and improved by rest. Pain tender to palpation  and reproducible with deep inspiration. EKG showed normal sinus rhythm with left axis deviation @ 51 bpm, Chest Xray showed no active cardiopulmonary disease. ASA 325mg  for pain. - Troponin: 0.00 - CBC: WNL - BMP: WNL  Pain likely musculoskeletal in nature. Given ASA here and discharged with prescription for Motrin 800mg  q8h PRN.  Final Clinical Impressions(s) / ED Diagnoses   Final diagnoses:  Precordial chest pain    New Prescriptions New Prescriptions   IBUPROFEN (ADVIL,MOTRIN) 800 MG TABLET    Take 1 tablet (800 mg total) by mouth every 8 (eight) hours as needed.     Beola CordMelvin, Shaterra Sanzone, MD 06/16/17 1038    Maia PlanLong, Joshua G, MD 06/16/17 1201

## 2017-06-16 NOTE — ED Triage Notes (Signed)
Pt reports onset yesterday of left side chest pain, denies sob. No acute distress is noted.

## 2018-03-27 IMAGING — DX DG CHEST 2V
2 series · 2 of 2 positions shown · non-contrast
Comparison: 11/16/2015

CLINICAL DATA: Left-sided chest pain beginning yesterday.

EXAM:
CHEST  2 VIEW

[chest lat]
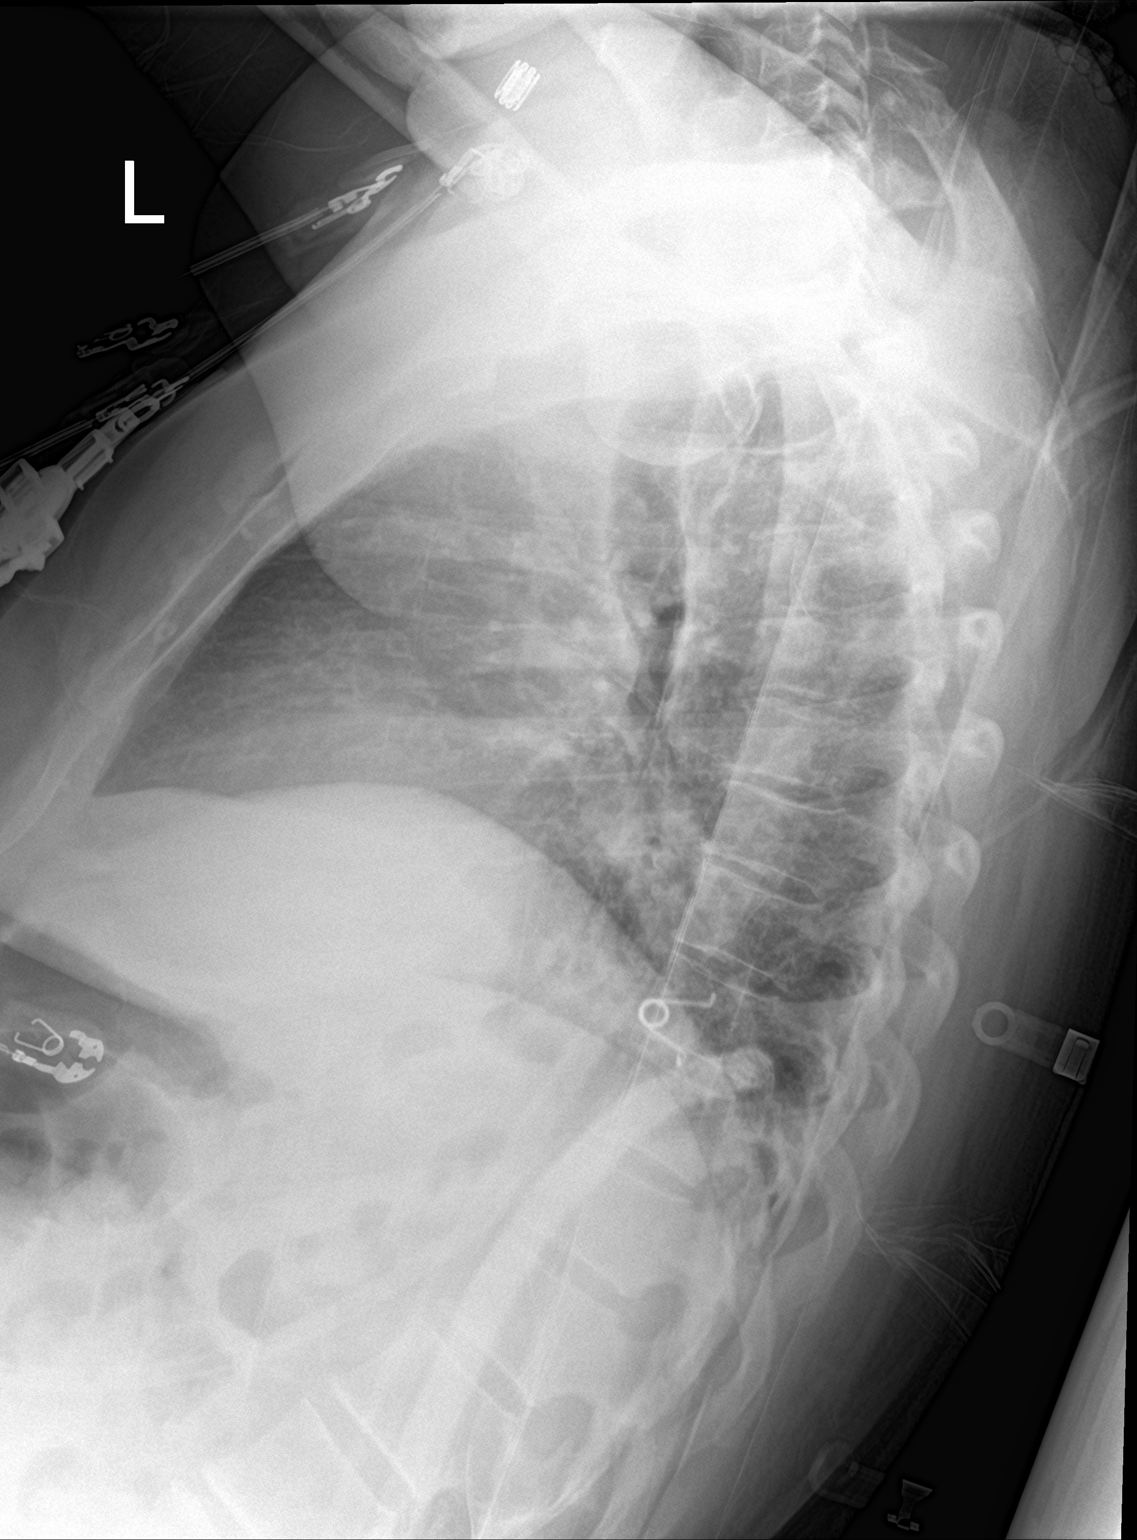

[chest ap]
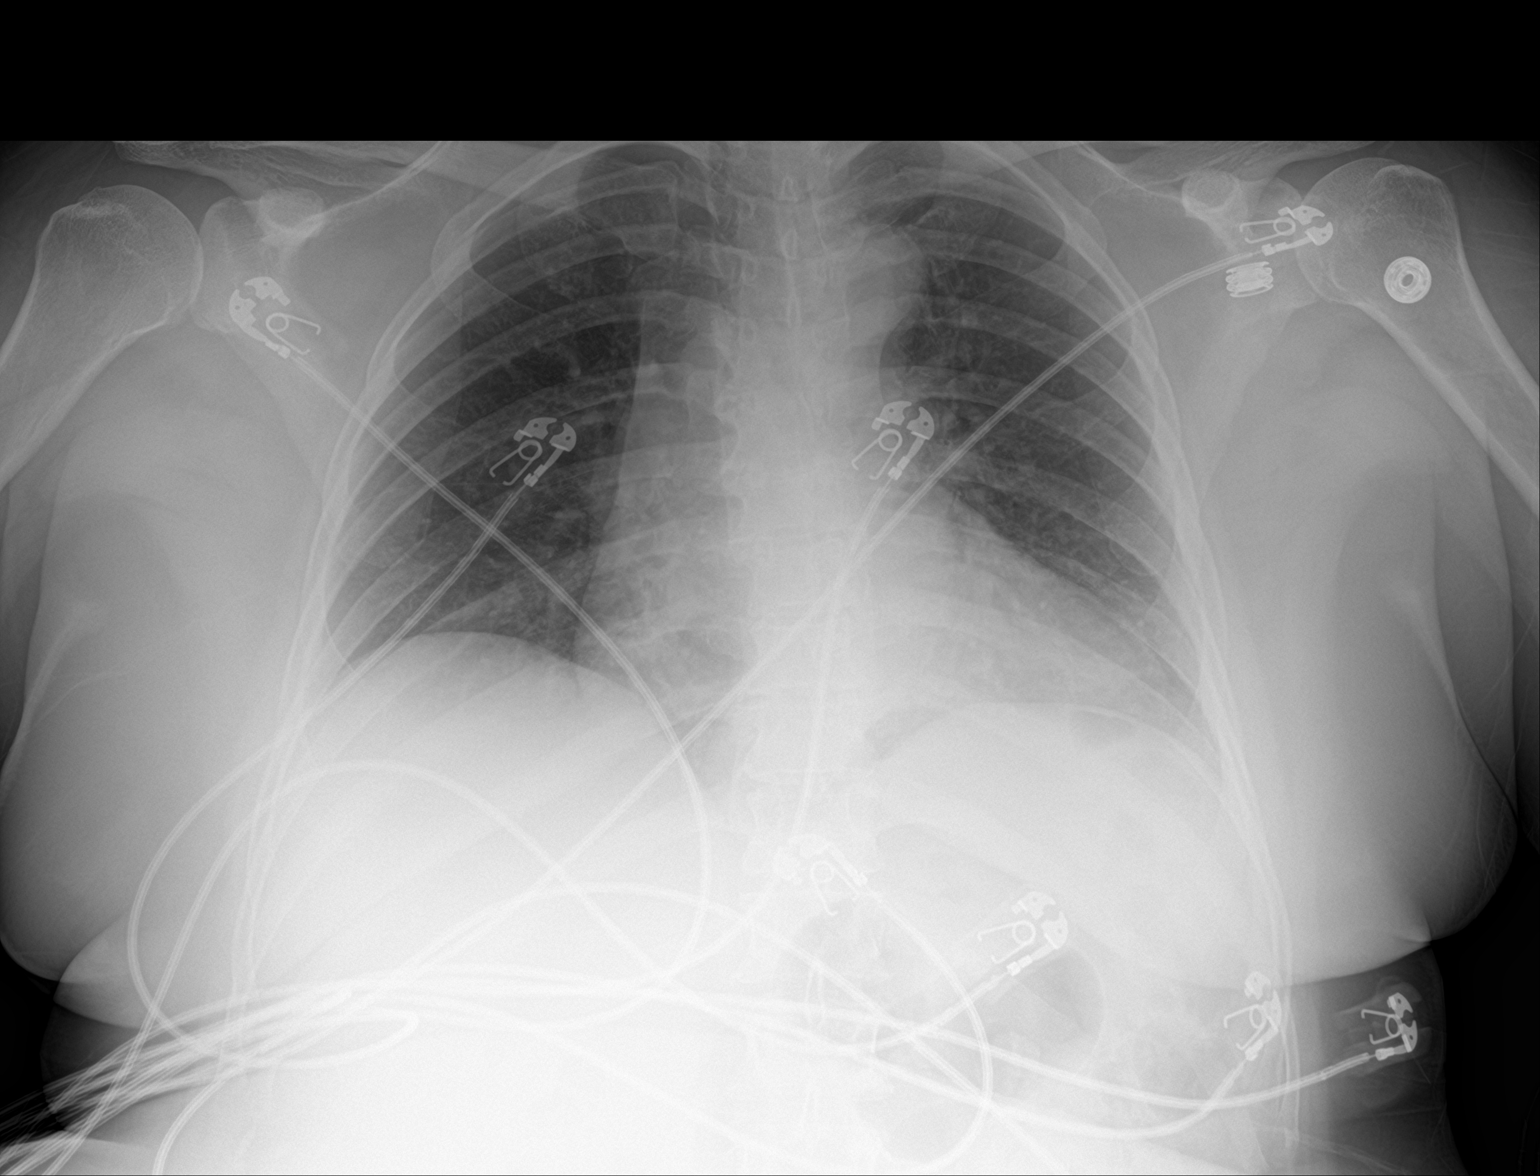

[2 of 2 positions shown; findings below may reference images not displayed]

FINDINGS: The heart size and mediastinal contours are within normal limits.
Both lungs are clear. The visualized skeletal structures are
unremarkable.
IMPRESSION: No active cardiopulmonary disease.

## 2019-09-30 ENCOUNTER — Encounter: Payer: Self-pay | Admitting: Obstetrics & Gynecology

## 2019-09-30 ENCOUNTER — Ambulatory Visit: Payer: Self-pay | Admitting: Gynecology

## 2019-09-30 ENCOUNTER — Ambulatory Visit (INDEPENDENT_AMBULATORY_CARE_PROVIDER_SITE_OTHER): Payer: Self-pay | Admitting: Obstetrics & Gynecology

## 2019-09-30 ENCOUNTER — Other Ambulatory Visit: Payer: Self-pay

## 2019-09-30 VITALS — BP 166/94 | Ht <= 58 in | Wt 156.0 lb

## 2019-09-30 DIAGNOSIS — N950 Postmenopausal bleeding: Secondary | ICD-10-CM

## 2019-09-30 DIAGNOSIS — N841 Polyp of cervix uteri: Secondary | ICD-10-CM

## 2019-09-30 NOTE — Progress Notes (Signed)
    Allison Hubbard 11/04/1961 [id number]        58 y.o.  G1P1L1 Single  RP: PMB x 3 days with a protruding lesion at the vulva x 2 occasions x 4 weeks.   HPI: Postmenopause, well on no HRT x >2 years.  No PMB until 4 weeks ago when she had mild spotting x 3 days.  At 2 occasions after being very active physically all day, she saw a protruding lesion at the vulva while wiping at the restroom.  Pelvic cramps on-off for 4 weeks.  Saw her chiropractor, but no improvement.  Last Screening mammo 02/2011 Negative.  Last pap 07/2014 Negative/HPV HR Negative.  No Colonoscopy yet.   OB History  Gravida Para Term Preterm AB Living  1 1       1   SAB TAB Ectopic Multiple Live Births               # Outcome Date GA Lbr Len/2nd Weight Sex Delivery Anes PTL Lv  1 Para             Past medical history,surgical history, problem list, medications, allergies, family history and social history were all reviewed and documented in the EPIC chart.   Directed ROS with pertinent positives and negatives documented in the history of present illness/assessment and plan.  Exam:  Vitals:   09/30/19 1223  BP: (!) 166/94  Weight: 156 lb (70.8 kg)  Height: 4' 9.75" (1.467 m)   General appearance:  Normal  Abdomen: Normal  Gynecologic exam: Vulva normal.  Speculum:  Cervix with a very large/long Endocervical Polyp protruding at the EO.  Vagina normal.  No active bleeding.  Normal vaginal secretions.  Verbal consent obtained.  Endocervical Polyp removed with a fenestrated clamp with rotation.  Specimen sent to Pathology.  Base of Endocervical Polyp cauterized with Silver Nitrate.  Good hemostasis.  Well tolerated, no Cx.  Bimanual exam:  Uterus AV, normal volume, mobile, NT.  No adnexal mass felt.  NT bilaterally.  No uterine prolapse.  No cysto-rectocele.   Assessment/Plan:  58 y.o. G1P1   1. Postmenopausal bleeding Postmenopausal bleeding for 3 days on no hormone replacement therapy.  Per gynecologic exam  today, large endocervical polyp could have been the cause of the bleeding.  The Endo cervical polyp was removed.  Patient will follow up with a pelvic ultrasound to assure that no other lesion is present in the endometrial cavity. - US Transvaginal Non-OB; Future  2. Endocervical polyp Large endocervical polyp removed easily.  Well-tolerated and no complication.  Sent to pathology. - Pathology Report ([facility])  Other orders - Aloe Vera 25 MG CAPS; Take by mouth. - Bee Pollen 1000 MG TABS; Take by mouth.  Patient will follow-up for a pelvic ultrasound.  Also needs to organize an annual gynecologic visit.  Counseling on above issues and coordination of care >50% x 30 minutes.  Princess Bruins MD, 1:10 PM 09/30/2019

## 2019-09-30 NOTE — Patient Instructions (Signed)
1. Postmenopausal bleeding Postmenopausal bleeding for 3 days on no hormone replacement therapy.  Per gynecologic exam today, large endocervical polyp could have been the cause of the bleeding.  The Endo cervical polyp was removed.  Patient will follow up with a pelvic ultrasound to assure that no other lesion is present in the endometrial cavity. - US Transvaginal Non-OB; Future  2. Endocervical polyp Large endocervical polyp removed easily.  Well-tolerated and no complication.  Sent to pathology. - Pathology Report ([facility])  Other orders - Aloe Vera 25 MG CAPS; Take by mouth. - Bee Pollen 1000 MG TABS; Take by mouth.  Patient will follow-up for a pelvic ultrasound.  Also needs to organize an annual gynecologic visit.  Shereda, fue un placer conocerle hoy!  Voy a informarle de sus [employer].

## 2019-10-02 LAB — PATHOLOGY REPORT

## 2019-10-02 LAB — TISSUE SPECIMEN

## 2019-10-03 ENCOUNTER — Ambulatory Visit (INDEPENDENT_AMBULATORY_CARE_PROVIDER_SITE_OTHER): Payer: Self-pay | Admitting: Obstetrics & Gynecology

## 2019-10-03 ENCOUNTER — Ambulatory Visit (INDEPENDENT_AMBULATORY_CARE_PROVIDER_SITE_OTHER): Payer: Self-pay

## 2019-10-03 ENCOUNTER — Other Ambulatory Visit: Payer: Self-pay

## 2019-10-03 DIAGNOSIS — N950 Postmenopausal bleeding: Secondary | ICD-10-CM

## 2019-10-03 DIAGNOSIS — N840 Polyp of corpus uteri: Secondary | ICD-10-CM

## 2019-10-03 NOTE — Progress Notes (Signed)
    Allison Hubbard Jun 10, 1961 [id number]        58 y.o.  G1P1L1 Single  RP: PMB/Endometrial Polyp prolapsed through cervix removed for Pelvic US  HPI: Postmenopause, well on no HRT x >2 years.  No PMB until 4 weeks ago when she had mild spotting x 3 days.  At 2 occasions after being very active physically all day, she saw a protruding lesion at the vulva while wiping at the restroom.  Pelvic cramps on-off for 4 weeks.  Saw her chiropractor, but no improvement.  At last visit 09/30/19 with me, I excised a large Polyp protruding into the vagina at the EO of the cervix.  Pathology showed a Benign Endometrial Polyp.   OB History  Gravida Para Term Preterm AB Living  1 1       1   SAB TAB Ectopic Multiple Live Births               # Outcome Date GA Lbr Len/2nd Weight Sex Delivery Anes PTL Lv  1 Para             Past medical history,surgical history, problem list, medications, allergies, family history and social history were all reviewed and documented in the EPIC chart.   Directed ROS with pertinent positives and negatives documented in the history of present illness/assessment and plan.  Exam:  There were no vitals filed for this visit. General appearance:  Normal  Pelvic US today: T/V images.  Small anteverted uterus normal in size and shape with no myometrial mass.  The uterus is measured at 6.36 x 4.21 x 3.22 cm.  The endometrial lining is thickened and asymmetrical measured at 10 mm with at least one endometrial mass suspected with a feeder vessel to the area identified.  The endometrial mass noted with 3D imaging is measured at 1.5 cm.  Both ovaries are small with atrophic appearance.  No adnexal mass seen.  No free fluid in the posterior cul-de-sac.   Assessment/Plan:  58 y.o. G1P1   1. Postmenopausal bleeding Postmenopausal bleeding probably due to a large endometrial polyp which was partially removed at last visit September 30, 2019.  Per pelvic ultrasound today an  endometrial mass is seen measured at 1.5 cm compatible with an endometrial polyp.  The decision was therefore made to proceed with a hysteroscopy with excision of the endometrial polyp and D&C.  Information was given to the patient as well as a pamphlet on hysteroscopy.  Patient will follow-up for preop visit.  2. Endometrial polyp Schedule HSC Myosure excision of Endometrial Polyp/D+C  Counseling on above issues and coordination of care more than 50% for 15 minutes.  Princess Bruins MD, 12:41 PM 10/03/2019

## 2019-10-04 ENCOUNTER — Telehealth: Payer: Self-pay

## 2019-10-04 NOTE — Telephone Encounter (Signed)
Rosemarie Ax called this Spanish speaking patient for me and relayed below:  "Patient is private pay. Estimated average allowable for her surgery fee is $532. She gets a 55% discount and that makes her surgery prepymt amount $239.00.   I only have one date left before the new year and that is Friday Dec 18th at 7:30 am and check in is 2 hours early at 5:30am at [facility].   She will need to go for drive thru Covid screen at [place]. ([facility] parking lot) on Tuesday Dec 15 before surgery. She stays in her car for this appointment. Tell her they ask me to advise her to stay in the right lane and DO NOT go to the tents. The tents are not for surgery testing. Let me know what time she would like to go and I will schedule her. Latest is 3pm.    I will mail her a packet with Covid sheet, [facility] pamphlet and financial letter. "  I schedule patient for 11/01/19 at 7:30am at [facility]. Covid screen scheduled for 10/29/19 at 9:30am.  Packet will be mailed.

## 2019-10-14 ENCOUNTER — Encounter: Payer: Self-pay | Admitting: Obstetrics & Gynecology

## 2019-10-14 NOTE — Patient Instructions (Signed)
1. Postmenopausal bleeding Postmenopausal bleeding probably due to a large endometrial polyp which was partially removed at last visit September 30, 2019.  Per pelvic ultrasound today an endometrial mass is seen measured at 1.5 cm compatible with an endometrial polyp.  The decision was therefore made to proceed with a hysteroscopy with excision of the endometrial polyp and D&C.  Information was given to the patient as well as a pamphlet on hysteroscopy.  Patient will follow-up for preop visit.  2. Endometrial polyp Schedule HSC Myosure excision of Endometrial Polyp/D+C  Nelia, fue un placer verle hoy!  Voy a informarle de sus [employer].

## 2019-10-29 ENCOUNTER — Other Ambulatory Visit (HOSPITAL_COMMUNITY)
Admission: RE | Admit: 2019-10-29 | Discharge: 2019-10-29 | Disposition: A | Discharge status: Home / Self Care | Payer: Self-pay | Source: Ambulatory Visit | Attending: Obstetrics & Gynecology | Admitting: Obstetrics & Gynecology

## 2019-10-29 DIAGNOSIS — Z20828 Contact with and (suspected) exposure to other viral communicable diseases: Secondary | ICD-10-CM | POA: Insufficient documentation

## 2019-10-29 DIAGNOSIS — Z01812 Encounter for preprocedural laboratory examination: Secondary | ICD-10-CM | POA: Insufficient documentation

## 2019-10-30 LAB — NOVEL CORONAVIRUS, NAA (HOSP ORDER, SEND-OUT TO REF LAB; TAT 18-24 HRS): SARS-CoV-2, NAA: NOT DETECTED

## 2019-10-31 ENCOUNTER — Encounter (HOSPITAL_BASED_OUTPATIENT_CLINIC_OR_DEPARTMENT_OTHER): Payer: Self-pay | Admitting: Obstetrics & Gynecology

## 2019-10-31 ENCOUNTER — Other Ambulatory Visit: Payer: Self-pay

## 2019-10-31 NOTE — Progress Notes (Signed)
Spoke w/ via phone for pre-op interview---[place] via [employer] hose # [phone] spanish interpreter ( email confimation for spanish interpreter for day of surgery on chart) Lab needs dos----    Cbc urine pregnancy          Lab results------ COVID test ------10-29-2019 Arrive at -------530 am 11-01-2019 NPO after ------mdinight Medications to take morning of surgery -----none Diabetic medication -----n/a Patient Special Instructions ----- Pre-Op special Istructions ----- Patient verbalized understanding of instructions that were given at this phone interview. Patient denies shortness of breath, chest pain, fever, cough a this phone interview.

## 2019-10-31 NOTE — Progress Notes (Addendum)
LEFT MESSAGE WITH KATHY SCHEDULER FOR DR LAVOIE UNABLE TO MAKE CONTACT WITH PATIENT AT [phone] X 2DAYS, PLEASE CALL WITH ANY OTHER  CONTACT NUMBERS

## 2019-11-01 ENCOUNTER — Ambulatory Visit (HOSPITAL_BASED_OUTPATIENT_CLINIC_OR_DEPARTMENT_OTHER): Payer: Self-pay | Admitting: Anesthesiology

## 2019-11-01 ENCOUNTER — Encounter (HOSPITAL_BASED_OUTPATIENT_CLINIC_OR_DEPARTMENT_OTHER): Payer: Self-pay | Admitting: Obstetrics & Gynecology

## 2019-11-01 ENCOUNTER — Other Ambulatory Visit: Payer: Self-pay

## 2019-11-01 ENCOUNTER — Encounter (HOSPITAL_BASED_OUTPATIENT_CLINIC_OR_DEPARTMENT_OTHER)
Admission: RE | Disposition: A | Discharge status: Home / Self Care | Payer: Self-pay | Source: Home / Self Care | Attending: Obstetrics & Gynecology

## 2019-11-01 ENCOUNTER — Ambulatory Visit (HOSPITAL_BASED_OUTPATIENT_CLINIC_OR_DEPARTMENT_OTHER)
Admission: RE | Admit: 2019-11-01 | Discharge: 2019-11-01 | Disposition: A | Discharge status: Home / Self Care | Payer: Self-pay | Attending: Obstetrics & Gynecology | Admitting: Obstetrics & Gynecology

## 2019-11-01 DIAGNOSIS — F329 Major depressive disorder, single episode, unspecified: Secondary | ICD-10-CM | POA: Insufficient documentation

## 2019-11-01 DIAGNOSIS — N840 Polyp of corpus uteri: Secondary | ICD-10-CM | POA: Insufficient documentation

## 2019-11-01 DIAGNOSIS — Z833 Family history of diabetes mellitus: Secondary | ICD-10-CM | POA: Insufficient documentation

## 2019-11-01 DIAGNOSIS — Z79899 Other long term (current) drug therapy: Secondary | ICD-10-CM | POA: Insufficient documentation

## 2019-11-01 DIAGNOSIS — Z8719 Personal history of other diseases of the digestive system: Secondary | ICD-10-CM | POA: Insufficient documentation

## 2019-11-01 DIAGNOSIS — I10 Essential (primary) hypertension: Secondary | ICD-10-CM | POA: Insufficient documentation

## 2019-11-01 DIAGNOSIS — N950 Postmenopausal bleeding: Secondary | ICD-10-CM | POA: Insufficient documentation

## 2019-11-01 DIAGNOSIS — Z87891 Personal history of nicotine dependence: Secondary | ICD-10-CM | POA: Insufficient documentation

## 2019-11-01 HISTORY — DX: Depression, unspecified: F32.A

## 2019-11-01 HISTORY — DX: Other complications of anesthesia, initial encounter: T88.59XA

## 2019-11-01 HISTORY — PX: DILATATION & CURETTAGE/HYSTEROSCOPY WITH MYOSURE: SHX6511

## 2019-11-01 HISTORY — DX: Postmenopausal bleeding: N95.0

## 2019-11-01 LAB — POCT I-STAT, CHEM 8
BUN: 15 mg/dL (ref 6–20)
Calcium, Ion: 1.33 mmol/L (ref 1.15–1.40)
Chloride: 103 mmol/L (ref 98–111)
Creatinine, Ser: 0.7 mg/dL (ref 0.44–1.00)
Glucose, Bld: 105 mg/dL — ABNORMAL HIGH (ref 70–99)
HCT: 41 % (ref 36.0–46.0)
Hemoglobin: 13.9 g/dL (ref 12.0–15.0)
Potassium: 3.8 mmol/L (ref 3.5–5.1)
Sodium: 140 mmol/L (ref 135–145)
TCO2: 27 mmol/L (ref 22–32)

## 2019-11-01 LAB — POCT PREGNANCY, URINE: Preg Test, Ur: NEGATIVE

## 2019-11-01 LAB — CBC
HCT: 42.3 % (ref 36.0–46.0)
Hemoglobin: 13.8 g/dL (ref 12.0–15.0)
MCH: 28.6 pg (ref 26.0–34.0)
MCHC: 32.6 g/dL (ref 30.0–36.0)
MCV: 87.8 fL (ref 80.0–100.0)
Platelets: 200 10*3/uL (ref 150–400)
RBC: 4.82 MIL/uL (ref 3.87–5.11)
RDW: 13.1 % (ref 11.5–15.5)
WBC: 6.7 10*3/uL (ref 4.0–10.5)
nRBC: 0 % (ref 0.0–0.2)

## 2019-11-01 SURGERY — DILATATION & CURETTAGE/HYSTEROSCOPY WITH MYOSURE
Anesthesia: General | Site: Vagina

## 2019-11-01 MED ORDER — PROPOFOL 10 MG/ML IV BOLUS
INTRAVENOUS | Status: DC | PRN
  Administered 2019-11-01: 160 mg via INTRAVENOUS

## 2019-11-01 MED ORDER — FENTANYL CITRATE (PF) 100 MCG/2ML IJ SOLN
25.0000 ug | INTRAMUSCULAR | Status: DC | PRN
  Filled 2019-11-01: qty 1

## 2019-11-01 MED ORDER — FENTANYL CITRATE (PF) 100 MCG/2ML IJ SOLN
INTRAMUSCULAR | Status: AC
  Filled 2019-11-01: qty 2

## 2019-11-01 MED ORDER — SODIUM CHLORIDE 0.9 % IR SOLN
Status: DC | PRN
  Administered 2019-11-01: 3000 mL

## 2019-11-01 MED ORDER — LIDOCAINE HCL 1 % IJ SOLN
INTRAMUSCULAR | Status: DC | PRN
  Administered 2019-11-01: 20 mL

## 2019-11-01 MED ORDER — ONDANSETRON HCL 4 MG/2ML IJ SOLN
INTRAMUSCULAR | Status: AC
  Filled 2019-11-01: qty 2

## 2019-11-01 MED ORDER — OXYCODONE HCL 5 MG PO TABS
5.0000 mg | ORAL_TABLET | Freq: Once | ORAL | Status: DC | PRN
  Filled 2019-11-01: qty 1

## 2019-11-01 MED ORDER — DEXAMETHASONE SODIUM PHOSPHATE 10 MG/ML IJ SOLN
INTRAMUSCULAR | Status: AC
  Filled 2019-11-01: qty 1

## 2019-11-01 MED ORDER — PROPOFOL 10 MG/ML IV BOLUS
INTRAVENOUS | Status: AC
  Filled 2019-11-01: qty 20

## 2019-11-01 MED ORDER — DEXAMETHASONE SODIUM PHOSPHATE 10 MG/ML IJ SOLN
INTRAMUSCULAR | Status: DC | PRN
  Administered 2019-11-01: 10 mg via INTRAVENOUS

## 2019-11-01 MED ORDER — ACETAMINOPHEN 10 MG/ML IV SOLN
1000.0000 mg | Freq: Once | INTRAVENOUS | Status: DC | PRN
  Filled 2019-11-01: qty 100

## 2019-11-01 MED ORDER — CEFAZOLIN SODIUM-DEXTROSE 2-4 GM/100ML-% IV SOLN
2.0000 g | INTRAVENOUS | Status: AC
  Administered 2019-11-01: 08:00:00 2 g via INTRAVENOUS
  Filled 2019-11-01: qty 100

## 2019-11-01 MED ORDER — ONDANSETRON HCL 4 MG/2ML IJ SOLN
INTRAMUSCULAR | Status: DC | PRN
  Administered 2019-11-01: 4 mg via INTRAVENOUS

## 2019-11-01 MED ORDER — ACETAMINOPHEN 500 MG PO TABS
ORAL_TABLET | ORAL | Status: AC
  Filled 2019-11-01: qty 2

## 2019-11-01 MED ORDER — ACETAMINOPHEN 500 MG PO TABS
1000.0000 mg | ORAL_TABLET | Freq: Once | ORAL | Status: AC | PRN
  Administered 2019-11-01: 10:00:00 1000 mg via ORAL
  Filled 2019-11-01: qty 2

## 2019-11-01 MED ORDER — MIDAZOLAM HCL 2 MG/2ML IJ SOLN
INTRAMUSCULAR | Status: AC
  Filled 2019-11-01: qty 2

## 2019-11-01 MED ORDER — CEFAZOLIN SODIUM-DEXTROSE 2-4 GM/100ML-% IV SOLN
INTRAVENOUS | Status: AC
  Filled 2019-11-01: qty 100

## 2019-11-01 MED ORDER — OXYCODONE HCL 5 MG/5ML PO SOLN
5.0000 mg | Freq: Once | ORAL | Status: DC | PRN
  Filled 2019-11-01: qty 5

## 2019-11-01 MED ORDER — LIDOCAINE 2% (20 MG/ML) 5 ML SYRINGE
INTRAMUSCULAR | Status: DC | PRN
  Administered 2019-11-01: 60 mg via INTRAVENOUS

## 2019-11-01 MED ORDER — LIDOCAINE 2% (20 MG/ML) 5 ML SYRINGE
INTRAMUSCULAR | Status: AC
  Filled 2019-11-01: qty 5

## 2019-11-01 MED ORDER — MIDAZOLAM HCL 5 MG/5ML IJ SOLN
INTRAMUSCULAR | Status: DC | PRN
  Administered 2019-11-01: 2 mg via INTRAVENOUS

## 2019-11-01 MED ORDER — LACTATED RINGERS IV SOLN
INTRAVENOUS | Status: DC
  Filled 2019-11-01: qty 1000

## 2019-11-01 MED ORDER — ACETAMINOPHEN 160 MG/5ML PO SOLN
1000.0000 mg | Freq: Once | ORAL | Status: AC | PRN
  Filled 2019-11-01: qty 40.6

## 2019-11-01 MED ORDER — FENTANYL CITRATE (PF) 100 MCG/2ML IJ SOLN
INTRAMUSCULAR | Status: DC | PRN
  Administered 2019-11-01 (×2): 50 ug via INTRAVENOUS

## 2019-11-01 MED ORDER — LACTATED RINGERS IV SOLN
INTRAVENOUS | Status: DC
  Administered 2019-11-01: 1000 mL via INTRAVENOUS
  Filled 2019-11-01: qty 1000

## 2019-11-01 SURGICAL SUPPLY — 24 items
CANISTER SUCT 3000ML PPV (MISCELLANEOUS) ×3 IMPLANT
CATH ROBINSON RED A/P 16FR (CATHETERS) ×3 IMPLANT
COVER WAND RF STERILE (DRAPES) ×3 IMPLANT
DEVICE MYOSURE LITE (MISCELLANEOUS) IMPLANT
DEVICE MYOSURE REACH (MISCELLANEOUS) ×2 IMPLANT
DILATOR CANAL MILEX (MISCELLANEOUS) IMPLANT
ELECT REM PT RETURN 9FT ADLT (ELECTROSURGICAL)
ELECTRODE REM PT RTRN 9FT ADLT (ELECTROSURGICAL) IMPLANT
GAUZE 4X4 16PLY RFD (DISPOSABLE) ×3 IMPLANT
GLOVE BIO SURGEON STRL SZ 6.5 (GLOVE) ×2 IMPLANT
GLOVE BIO SURGEONS STRL SZ 6.5 (GLOVE) ×1
GLOVE BIOGEL PI IND STRL 7.0 (GLOVE) ×2 IMPLANT
GLOVE BIOGEL PI INDICATOR 7.0 (GLOVE) ×4
GOWN STRL REUS W/TWL LRG LVL3 (GOWN DISPOSABLE) ×6 IMPLANT
IV NS IRRIG 3000ML ARTHROMATIC (IV SOLUTION) ×4 IMPLANT
KIT PROCEDURE FLUENT (KITS) ×3 IMPLANT
MYOSURE XL FIBROID (MISCELLANEOUS)
PACK VAGINAL MINOR WOMEN LF (CUSTOM PROCEDURE TRAY) ×3 IMPLANT
PAD OB MATERNITY 4.3X12.25 (PERSONAL CARE ITEMS) ×3 IMPLANT
PAD PREP 24X48 CUFFED NSTRL (MISCELLANEOUS) ×3 IMPLANT
SEAL CERVICAL OMNI LOK (ABLATOR) IMPLANT
SEAL ROD LENS SCOPE MYOSURE (ABLATOR) ×3 IMPLANT
SYSTEM TISS REMOVAL MYOSURE XL (MISCELLANEOUS) IMPLANT
TOWEL OR 17X26 10 PK STRL BLUE (TOWEL DISPOSABLE) ×4 IMPLANT

## 2019-11-01 NOTE — Transfer of Care (Signed)
Immediate Anesthesia Transfer of Care Note  Patient: Allison Hubbard  Procedure(s) Performed: DILATATION & CURETTAGE/HYSTEROSCOPY WITH MYOSURE (N/A Vagina )  Patient Location: PACU  Anesthesia Type:General  Level of Consciousness: awake, alert  and oriented  Airway & Oxygen Therapy: Patient Spontanous Breathing and Patient connected to nasal cannula oxygen  Post-op Assessment: Report given to RN and Post -op Vital signs reviewed and stable  Post vital signs: Reviewed and stable  Last Vitals:  Vitals Value Taken Time  BP    Temp    Pulse 62 11/01/19 0815  Resp 19 11/01/19 0815  SpO2 100 % 11/01/19 0815  Vitals shown include unvalidated device data.  Last Pain:  Vitals:   11/01/19 0540  TempSrc: Oral      Patients Stated Pain Goal: 6 (36/46/80 3212)  Complications: No apparent anesthesia complications

## 2019-11-01 NOTE — H&P (Signed)
Allison Hubbard is an 57 y.o. female. G1P1L1 Single  RP: PMB/Endometrial Polyp for HSC/Myosure Excision/D+C  HPI: Postmenopause, well on no HRT x >2 years. No PMB until 4 weeks ago when she had mild spotting x 3 days. At 2 occasions after being very active physically all day, she saw a protruding lesion at the vulva while wiping at the restroom. Pelvic cramps on-off for 4 weeks. Saw her chiropractor, but no improvement.  On 09/30/19, I excised a large Polyp protruding into the vagina at the EO of the cervix.  Pathology showed a Benign Endometrial Polyp.  Menstrual History: Patient's last menstrual period was 07/31/2016.    Past Medical History:  Diagnosis Date  . Complication of anesthesia 1994 may   had feeling with c section surgery  . Depression    7 years ago. no problems now.  . Gastritis   . Hypertension   . Postmenopausal bleeding     Past Surgical History:  Procedure Laterality Date  . CESAREAN SECTION      Family History  Problem Relation Age of Onset  . Diabetes Mother   . Diabetes Sister     Social History:  reports that she has quit smoking. Her smoking use included cigarettes. She has never used smokeless tobacco. She reports current alcohol use. She reports that she does not use drugs.  Allergies: No Known Allergies  Medications Prior to Admission  Medication Sig Dispense Refill Last Dose  . Aloe Vera 25 MG CAPS Take by mouth.   10/31/2019 at Unknown time  . Ascorbic Acid (VITAMIN C) 100 MG tablet Take 100 mg by mouth daily.   10/31/2019 at Unknown time  . lisinopril (ZESTRIL) 20 MG tablet Take 20 mg by mouth daily.   10/30/2019  . Bee Pollen 1000 MG TABS Take by mouth.   10/30/2019  . Omega-3 Fatty Acids (FISH OIL) 1000 MG CAPS Take 1 capsule by mouth daily.    10/30/2019    REVIEW OF SYSTEMS: A ROS was performed and pertinent positives and negatives are included in the history.  GENERAL: No fevers or chills. HEENT: No change in vision, no  earache, sore throat or sinus congestion. NECK: No pain or stiffness. CARDIOVASCULAR: No chest pain or pressure. No palpitations. PULMONARY: No shortness of breath, cough or wheeze. GASTROINTESTINAL: No abdominal pain, nausea, vomiting or diarrhea, melena or bright red blood per rectum. GENITOURINARY: No urinary frequency, urgency, hesitancy or dysuria. MUSCULOSKELETAL: No joint or muscle pain, no back pain, no recent trauma. DERMATOLOGIC: No rash, no itching, no lesions. ENDOCRINE: No polyuria, polydipsia, no heat or cold intolerance. No recent change in weight. HEMATOLOGICAL: No anemia or easy bruising or bleeding. NEUROLOGIC: No headache, seizures, numbness, tingling or weakness. PSYCHIATRIC: No depression, no loss of interest in normal activity or change in sleep pattern.     Blood pressure 120/79, pulse 62, temperature 97.7 F (36.5 C), temperature source Oral, resp. rate 16, height 4\' 11"  (1.499 m), weight 69 kg, last menstrual period 07/31/2016, SpO2 99 %.  Physical Exam:  See office notes   Results for orders placed or performed during the hospital encounter of 11/01/19 (from the past 24 hour(s))  Pregnancy, urine POC     Status: None   Collection Time: 11/01/19  6:00 AM  Result Value Ref Range   Preg Test, Ur NEGATIVE NEGATIVE  CBC     Status: None   Collection Time: 11/01/19  6:10 AM  Result Value Ref Range   WBC 6.7 4.0 -  10.5 K/uL   RBC 4.82 3.87 - 5.11 MIL/uL   Hemoglobin 13.8 12.0 - 15.0 g/dL   HCT 64.3 32.9 - 51.8 %   MCV 87.8 80.0 - 100.0 fL   MCH 28.6 26.0 - 34.0 pg   MCHC 32.6 30.0 - 36.0 g/dL   RDW 84.1 66.0 - 63.0 %   Platelets 200 150 - 400 K/uL   nRBC 0.0 0.0 - 0.2 %  I-STAT, chem 8     Status: Abnormal   Collection Time: 11/01/19  6:15 AM  Result Value Ref Range   Sodium 140 135 - 145 mmol/L   Potassium 3.8 3.5 - 5.1 mmol/L   Chloride 103 98 - 111 mmol/L   BUN 15 6 - 20 mg/dL   Creatinine, Ser 1.60 0.44 - 1.00 mg/dL   Glucose, Bld 109 (H) 70 - 99 mg/dL    Calcium, Ion 3.23 5.57 - 1.40 mmol/L   TCO2 27 22 - 32 mmol/L   Hemoglobin 13.9 12.0 - 15.0 g/dL   HCT 32.2 02.5 - 42.7 %   Covid Negative  Pelvic US 10/03/19: T/V images.  Small anteverted uterus normal in size and shape with no myometrial mass.  The uterus is measured at 6.36 x 4.21 x 3.22 cm.  The endometrial lining is thickened and asymmetrical measured at 10 mm with at least one endometrial mass suspected with a feeder vessel to the area identified.  The endometrial mass noted with 3D imaging is measured at 1.5 cm.  Both ovaries are small with atrophic appearance.  No adnexal mass seen.  No free fluid in the posterior cul-de-sac.   Assessment/Plan:  58 y.o. G1P1   1. Postmenopausal bleeding Postmenopausal bleeding probably due to a large endometrial polyp which was partially removed at last visit September 30, 2019.  Per pelvic ultrasound today an endometrial mass is seen measured at 1.5 cm compatible with an endometrial polyp.  The decision was therefore made to proceed with a hysteroscopy with excision of the endometrial polyp and D&C.  Information was given to the patient as well as a pamphlet on hysteroscopy.  2. Endometrial polyp Schedule HSC Myosure excision of Endometrial Polyp/D+C                        Patient was counseled as to the risk of surgery to include the following:  1. Infection (prohylactic antibiotics will be administered)  2. DVT/Pulmonary Embolism (prophylactic pneumo compression stockings will be used)  3.Trauma to internal organs requiring additional surgical procedure to repair any injury to internal organs requiring perhaps additional hospitalization days.  4.Hemmorhage requiring transfusion and blood products which carry risks such as anaphylactic reaction, hepatitis and AIDS  Patient had received literature information on the procedure scheduled and all her questions were answered and fully accepts all risk.   Marie-Lyne Adhya Cocco 11/01/2019, 7:22  AM

## 2019-11-01 NOTE — Anesthesia Procedure Notes (Signed)
Procedure Name: LMA Insertion Date/Time: 11/01/2019 7:41 AM Performed by: Wayden Schwertner D, CRNA Pre-anesthesia Checklist: Patient identified, Emergency Drugs available, Suction available and Patient being monitored Patient Re-evaluated:Patient Re-evaluated prior to induction Oxygen Delivery Method: Circle system utilized Preoxygenation: Pre-oxygenation with 100% oxygen Induction Type: IV induction Ventilation: Mask ventilation without difficulty LMA: LMA inserted LMA Size: 3.0 Tube type: Oral Number of attempts: 1 Placement Confirmation: positive ETCO2 and breath sounds checked- equal and bilateral Tube secured with: Tape Dental Injury: Teeth and Oropharynx as per pre-operative assessment

## 2019-11-01 NOTE — Discharge Instructions (Addendum)
Histeroscopa, cuidados posteriores Hysteroscopy, Care After Lea esta informacin sobre cmo cuidarse despus del procedimiento. Su mdico tambin podr darle indicaciones ms especficas. Comunquese con su mdico si tiene problemas o preguntas. Qu puedo esperar despus del procedimiento? Despus del procedimiento, es comn DIRECTVtener los siguientes sntomas:  Stonewallalambres.  Hemorragia. Puede variar desde un leve goteo hasta un manchado similar al menstrual. Siga estas indicaciones en su casa: Actividad  Haga reposo Energy Transfer Partnersdurante los primeros 1 a 2 das despus del procedimiento.  No se haga duchas vaginales, no use tampones ni tenga relaciones sexuales por 2semanas despus del procedimiento, o hasta que el mdico lo autorice.  No conduzca durante 24horas despus del procedimiento, o durante el tiempo que le haya indicado el mdico.  No conduzca, no use maquinaria pesada ni tome alcohol mientras toma analgsicos recetados. Medicamentos   Baxter Internationalome los medicamentos de venta libre y los recetados solamente como se lo haya indicado el mdico.  No tome aspirina durante la recuperacin. Puede aumentar el riesgo de sangrado. Instrucciones generales  No tome baos de inmersin, no nade ni use el jacuzzi hasta que el mdico lo autorice. Tome duchas en lugar de baos durante 2semanas, o durante el tiempo que le haya indicado el mdico.  A fin de prevenir o tratar el estreimiento mientras toma analgsicos recetados, el mdico puede recomendarle lo siguiente: ? Product managerBeber suficiente lquido como para mantener la orina clara o de color amarillo plido. ? Tomar medicamentos recetados o de H. J. Heinzventa libre. ? Consumir alimentos ricos en fibra, como frutas y verduras frescas, cereales integrales y frijoles. ? Limitar el consumo de alimentos ricos en grasa y azcares procesados, como alimentos fritos o dulces.  Concurra a todas las visitas de [address]seguimiento como se lo haya indicado el mdico. Esto es  importante. Comunquese con un mdico si:  Se siente mareado o sufre un desmayo.  Siente nuseas.  Tiene una secrecin vaginal anormal.  Tiene una erupcin cutnea.  El dolor no mejora con medicamentos.  Tiene escalofros. Solicite ayuda de inmediato si:  Tiene una hemorragia ms abundante que un perodo menstrual normal.  Tiene fiebre.  Siente dolor o clicos que empeoran.  Siente dolor abdominal.  Se desmaya.  Siente dolor en los hombros.  Le falta el aire. Resumen  Despus del procedimiento, es posible que tenga calambres y sangrado vaginal leve.  No se haga duchas vaginales, no use tampones ni tenga relaciones sexuales por Marsh & McLennandos semanas despus del procedimiento, o hasta que el mdico lo autorice.  No tome baos de inmersin, no nade ni use el jacuzzi hasta que el mdico lo autorice. Tome duchas en lugar de baos durante 2semanas, o durante el tiempo que le haya indicado el mdico.  Infrmele al mdico si tiene sntomas fuera de lo comn.  Concurra a todas las visitas de [address]seguimiento como se lo haya indicado el mdico. Esto es importante. Esta informacin no tiene Theme park managercomo fin reemplazar el consejo del mdico. Asegrese de hacerle al mdico cualquier pregunta que tenga. Document Released: 08/21/2013 Document Revised: 07/07/2017 Document Reviewed: 07/07/2017 Elsevier Patient Education  2020 Elsevier Inc.  Anestesia general en adultos, cuidados posteriores General Anesthesia, Adult, Care After Lea esta informacin sobre cmo cuidarse despus del procedimiento. El mdico tambin podr darle instrucciones ms especficas. Comunquese con su mdico si tiene problemas o preguntas. Qu puedo esperar despus del procedimiento? Luego del procedimiento, son comunes los siguiente efectos secundarios:  Dolor o Associate Professormolestias en el lugar de la va intravenosa (i.v.).  Nuseas.  Vmitos.  Dolor de Advertising copywritergarganta.  Dificultad para concentrarse.  Sentir fro o [employer].  Debilidad o cansancio.  Somnolencia y Management consultant.  Malestar y Tourist information centre manager. Estos efectos secundarios pueden afectar partes del cuerpo que no estuvieron involucradas en la ciruga. Siga estas indicaciones en su casa:  Durante al menos 24horas despus del procedimiento:  Pdale a un adulto responsable que permanezca con usted. Es importante que alguien cuide de usted hasta que se despierte y Statistician.  Descanse todo lo que sea necesario.  No haga lo siguiente: ? Participar en actividades en las que podra caerse o lastimarse. ? Conducir. ? Operar maquinarias pesadas. ? Beber alcohol. ? Tomar somnferos o medicamentos que causen somnolencia. ? Firmar documentos legales ni tomar [employer]. ? Cuidar a nios por su cuenta. Qu debe comer y beber  Siga las indicaciones del mdico respecto de las restricciones de comidas o bebidas.  Cuando [employer], comience a comer cantidades pequeas de alimentos que sean blandos y fciles de Location manager (livianos), como una tostada. Retome su dieta habitual de forma gradual.  Beba suficiente lquido como para mantener la orina de color amarillo plido.  Si vomita, rehidrtese tomando agua, jugo o caldo transparente. Instrucciones generales  Si tiene apnea del sueo, la [place] y ciertos medicamentos pueden aumentar el riesgo de problemas respiratorios. Siga las indicaciones del mdico respecto al uso de su dispositivo para dormir: ? Siempre que duerma, incluso durante las siestas que tome en el da. ? Mientras tome analgsicos recetados, medicamentos para dormir o medicamentos que producen somnolencia.  Reanude sus actividades normales segn lo indicado por el mdico. Pregntele al mdico qu actividades son seguras para usted.  Tome los medicamentos de venta libre y los recetados solamente como se lo haya indicado el mdico.  Si fuma, no lo haga sin supervisin.  Concurra a todas las visitas de [address] se  lo haya indicado el mdico. Esto es importante. Comunquese con un mdico si:  Tiene nuseas o vmitos que no mejoran con medicamentos.  No puede comer ni beber sin vomitar.  El dolor no se alivia con medicamentos.  No puede orinar.  Tiene una erupcin cutnea.  Tiene fiebre.  Presenta enrojecimiento alrededor del lugar de la va intravenosa (i.v.) que empeora. Solicite ayuda de inmediato si:  Tiene dificultad para respirar.  Siente dolor en el pecho.  Observa sangre en la orina o heces, o vomita sangre. Resumen  Despus del procedimiento, es comn tener dolor de garganta y nuseas. Tambin es comn sentirse cansado.  Pdale a un adulto responsable que permanezca con usted durante 24 horas despus de la anestesia general. Es importante que alguien cuide de usted hasta que se despierte y Statistician.  Cuando [employer], comience a comer cantidades pequeas de alimentos que sean blandos y fciles de Location manager (livianos), como una tostada. Retome su dieta habitual de forma gradual.  Beba suficiente lquido como para mantener la orina de color amarillo plido.  Reanude sus actividades normales segn lo indicado por el mdico. Pregntele al mdico qu actividades son seguras para usted. Esta informacin no tiene Theme park manager el consejo del mdico. Asegrese de hacerle al mdico cualquier pregunta que tenga. Document Released: 10/31/2005 Document Revised: 08/28/2017 Document Reviewed: 08/28/2017 Elsevier Patient Education  2020 Elsevier Inc.   Hysteroscopy, Care After This sheet gives you information about how to care for yourself after your procedure. Your health care provider may also give you more specific instructions. If you have problems or questions, contact your health care provider. What can  I expect after the procedure? After the procedure, it is common to have:  Cramping.  Bleeding. This can vary from light spotting to menstrual-like bleeding. Follow these  instructions at home: Activity  Rest for 1-2 days after the procedure.  Do not douche, use tampons, or have sex for 2 weeks after the procedure, or until your health care provider approves.  Do not drive for 24 hours after the procedure, or for as long as told by your health care provider.  Do not drive, use heavy machinery, or drink alcohol while taking prescription pain medicines. Medicines   Take over-the-counter and prescription medicines only as told by your health care provider.  Do not take aspirin during recovery. It can increase the risk of bleeding. General instructions  Do not take baths, swim, or use a hot tub until your health care provider approves. Take showers instead of baths for 2 weeks, or for as long as told by your health care provider.  To prevent or treat constipation while you are taking prescription pain medicine, your health care provider may recommend that you: ? Drink enough fluid to keep your urine clear or pale yellow. ? Take over-the-counter or prescription medicines. ? Eat foods that are high in fiber, such as fresh fruits and vegetables, whole grains, and beans. ? Limit foods that are high in fat and processed sugars, such as fried and sweet foods.  Keep all follow-up visits as told by your health care provider. This is important. Contact a health care provider if:  You feel dizzy or lightheaded.  You feel nauseous.  You have abnormal vaginal discharge.  You have a rash.  You have pain that does not get better with medicine.  You have chills. Get help right away if:  You have bleeding that is heavier than a normal menstrual period.  You have a fever.  You have pain or cramps that get worse.  You develop new abdominal pain.  You faint.  You have pain in your shoulders.  You have shortness of breath. Summary  After the procedure, you may have cramping and some vaginal bleeding.  Do not douche, use tampons, or have sex for 2  weeks after the procedure, or until your health care provider approves.  Do not take baths, swim, or use a hot tub until your health care provider approves. Take showers instead of baths for 2 weeks, or for as long as told by your health care provider.  Report any unusual symptoms to your health care provider.  Keep all follow-up visits as told by your health care provider. This is important. This information is not intended to replace advice given to you by your health care provider. Make sure you discuss any questions you have with your health care provider. Document Released: 08/21/2013 Document Revised: 10/13/2017 Document Reviewed: 11/29/2016 Elsevier Patient Education  2020 Elsevier Inc.  General Anesthesia, Adult, Care After This sheet gives you information about how to care for yourself after your procedure. Your health care provider may also give you more specific instructions. If you have problems or questions, contact your health care provider. What can I expect after the procedure? After the procedure, the following side effects are common:  Pain or discomfort at the IV site.  Nausea.  Vomiting.  Sore throat.  Trouble concentrating.  Feeling cold or chills.  Weak or tired.  Sleepiness and fatigue.  Soreness and body aches. These side effects can affect parts of the body that were not involved  in surgery. Follow these instructions at home:  For at least 24 hours after the procedure:  Have a responsible adult stay with you. It is important to have someone help care for you until you are awake and alert.  Rest as needed.  Do not: ? Participate in activities in which you could fall or become injured. ? Drive. ? Use heavy machinery. ? Drink alcohol. ? Take sleeping pills or medicines that cause drowsiness. ? Make important decisions or sign legal documents. ? Take care of children on your own. Eating and drinking  Follow any instructions from your health  care provider about eating or drinking restrictions.  When you feel hungry, start by eating small amounts of foods that are soft and easy to digest (bland), such as toast. Gradually return to your regular diet.  Drink enough fluid to keep your urine pale yellow.  If you vomit, rehydrate by drinking water, juice, or clear broth. General instructions  If you have sleep apnea, surgery and certain medicines can increase your risk for breathing problems. Follow instructions from your health care provider about wearing your sleep device: ? Anytime you are sleeping, including during daytime naps. ? While taking prescription pain medicines, sleeping medicines, or medicines that make you drowsy.  Return to your normal activities as told by your health care provider. Ask your health care provider what activities are safe for you.  Take over-the-counter and prescription medicines only as told by your health care provider.  If you smoke, do not smoke without supervision.  Keep all follow-up visits as told by your health care provider. This is important. Contact a health care provider if:  You have nausea or vomiting that does not get better with medicine.  You cannot eat or drink without vomiting.  You have pain that does not get better with medicine.  You are unable to pass urine.  You develop a skin rash.  You have a fever.  You have redness around your IV site that gets worse. Get help right away if:  You have difficulty breathing.  You have chest pain.  You have blood in your urine or stool, or you vomit blood. Summary  After the procedure, it is common to have a sore throat or nausea. It is also common to feel tired.  Have a responsible adult stay with you for the first 24 hours after general anesthesia. It is important to have someone help care for you until you are awake and alert.  When you feel hungry, start by eating small amounts of foods that are soft and easy to  digest (bland), such as toast. Gradually return to your regular diet.  Drink enough fluid to keep your urine pale yellow.  Return to your normal activities as told by your health care provider. Ask your health care provider what activities are safe for you. This information is not intended to replace advice given to you by your health care provider. Make sure you discuss any questions you have with your health care provider. Document Released: 02/06/2001 Document Revised: 11/03/2017 Document Reviewed: 06/16/2017 Elsevier Patient Education  2020 [employer].

## 2019-11-01 NOTE — Anesthesia Postprocedure Evaluation (Signed)
Anesthesia Post Note  Patient: Amalea Sosa Valera  Procedure(s) Performed: DILATATION & CURETTAGE/HYSTEROSCOPY WITH MYOSURE (N/A Vagina )     Patient location during evaluation: PACU Anesthesia Type: General Level of consciousness: awake and alert Pain management: pain level controlled Vital Signs Assessment: post-procedure vital signs reviewed and stable Respiratory status: spontaneous breathing, nonlabored ventilation, respiratory function stable and patient connected to nasal cannula oxygen Cardiovascular status: blood pressure returned to baseline and stable Postop Assessment: no apparent nausea or vomiting Anesthetic complications: no    Last Vitals:  Vitals:   11/01/19 0900 11/01/19 0945  BP: 133/88 132/86  Pulse: (!) 52 (!) 51  Resp: 16 14  Temp:  37 C  SpO2: 98% 98%    Last Pain:  Vitals:   11/01/19 0945  TempSrc:   PainSc: 5                  Mckinlee Dunk

## 2019-11-01 NOTE — Op Note (Signed)
Operative Note  11/01/2019  8:15 AM  PATIENT:  Allison Hubbard  58 y.o. female  PRE-OPERATIVE DIAGNOSIS:  Post menopausal bleeding, Endometrial polyp  POST-OPERATIVE DIAGNOSIS:  Post menopausal bleeding, Endometrial polyp  PROCEDURE:  Procedure(s): HYSTEROSCOPY WITH MYOSURE EXCISION/DILATATION & CURETTAGE  SURGEON:  Surgeon(s): Princess Bruins, MD  ANESTHESIA:   general  FINDINGS: Large Endometrial Polyp originating close to the left Ostium.  Both Ostia well seen.  Uterine cavity otherwise normal.  DESCRIPTION OF OPERATION: Under general anesthesia with laryngeal mask the patient is in lithotomy position.  She is prepped with Betadine on the suprapubic, vulvar and vaginal areas.  The bladder is catheterized.  The patient is draped as usual.  Timeout is done.  The vaginal exam reveals an anteverted uterus, normal volume, mobile.  No adnexal mass.  The speculum is inserted in the vagina and the anterior lip of the cervix is grasped with a tenaculum.  A paracervical block is done with Xylocaine 1% a total of 20 cc at 4 and 8:00.  Hysterometry is 8 cm.  Dilation of the cervix with Pratt dilators up to #21 without difficulty.  Insertion of the hysteroscope in the intrauterine cavity.  Inspection reveals a large endometrial polyp originating close to the left ostium.  This represents the left over polyp that had been removed as it was prolapsing through the cervix into the vagina previously in the office.  Pictures are taken.  We then inserted the Myosure reach instrument.  The endometrial polyp is completely excised easily.  Pictures are taken after excision.  Hemostasis is adequate at all levels.  The hysteroscope with MyoSure are removed.  We proceeded with a systematic curettage of the intra uterine cavity on all surfaces with a sharp curette. The curette is removed.  The specimens are sent together to pathology.  The tenaculum is removed from the cervix.  Hemostasis is adequate.  The  speculum was also removed.  The patient is brought to recovery room in good and stable status.  ESTIMATED BLOOD LOSS: 5 mL Fluid Deficit 80 cc  Intake/Output Summary (Last 24 hours) at 11/01/2019 0815 Last data filed at 11/01/2019 0802 Gross per 24 hour  Intake -  Output 205 ml  Net -205 ml     BLOOD ADMINISTERED:none   LOCAL MEDICATIONS USED:  XYLOCAINE 1% for Paracervical Block  SPECIMEN:  Source of Specimen:  Excision of Polyp and Endometrial Curettings  DISPOSITION OF SPECIMEN:  PATHOLOGY  COUNTS:  YES  PLAN OF CARE: Transfer to PACU  Marie-Lyne LavoieMD8:15 AM

## 2019-11-01 NOTE — Anesthesia Preprocedure Evaluation (Addendum)
Anesthesia Evaluation  Patient identified by MRN, date of birth, ID band Patient awake    Reviewed: Allergy & Precautions, NPO status , Patient's Chart, lab work & pertinent test results  History of Anesthesia Complications Negative for: history of anesthetic complications  Airway Mallampati: II  TM Distance: >3 FB Neck ROM: Full    Dental  (+) Dental Advisory Given   Pulmonary former smoker,    breath sounds clear to auscultation       Cardiovascular hypertension, Pt. on medications (-) angina(-) Past MI and (-) CHF  Rhythm:Regular     Neuro/Psych PSYCHIATRIC DISORDERS Depression    GI/Hepatic negative GI ROS, Neg liver ROS,   Endo/Other  negative endocrine ROS  Renal/GU negative Renal ROS     Musculoskeletal negative musculoskeletal ROS (+)   Abdominal   Peds  Hematology negative hematology ROS (+)   Anesthesia Other Findings   Reproductive/Obstetrics                            Anesthesia Physical Anesthesia Plan  ASA: II  Anesthesia Plan: General   Post-op Pain Management:    Induction: Intravenous  PONV Risk Score and Plan: 3 and Ondansetron and Dexamethasone  Airway Management Planned: LMA  Additional Equipment: None  Intra-op Plan:   Post-operative Plan: Extubation in OR  Informed Consent: I have reviewed the patients History and Physical, chart, labs and discussed the procedure including the risks, benefits and alternatives for the proposed anesthesia with the patient or authorized representative who has indicated his/her understanding and acceptance.     Dental advisory given  Plan Discussed with: CRNA and Surgeon  Anesthesia Plan Comments:         Anesthesia Quick Evaluation

## 2019-11-04 LAB — SURGICAL PATHOLOGY

## 2019-11-11 ENCOUNTER — Other Ambulatory Visit: Payer: Self-pay

## 2019-11-11 ENCOUNTER — Encounter: Payer: Self-pay | Admitting: Women's Health

## 2019-11-11 ENCOUNTER — Ambulatory Visit (INDEPENDENT_AMBULATORY_CARE_PROVIDER_SITE_OTHER): Payer: Self-pay | Admitting: Women's Health

## 2019-11-11 VITALS — BP 120/80

## 2019-11-11 DIAGNOSIS — R300 Dysuria: Secondary | ICD-10-CM

## 2019-11-11 MED ORDER — SULFAMETHOXAZOLE-TRIMETHOPRIM 800-160 MG PO TABS: 1.0000 | ORAL_TABLET | Freq: Two times a day (BID) | ORAL | 0 refills | Status: DC

## 2019-11-11 NOTE — Progress Notes (Signed)
58 year old Sh F G1 P1 presents with complaint of <1 day history of pain at end of urination with frequency.  Having mild pelvic pain/cramping with urination. States did take Motrin this morning which  helped.  11/01/2019 D&C/hysteroscope for benign endometrial polyps per Dr. Dellis Filbert, was having postmenopausal bleeding.  Denies vaginal discharge, itching or odor, back pain or fever.  Postmenopausal no HRT.  Medical problems include hypertension.  Interpreter Ana present.  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 Dellis Filbert on January 8.  Denies any heavy lifting, reports resting well, not sexually active in years.  Exam: Appears well.  No CVAT.  Abdomen obese, no rebound or radiation of pain.  External genitalia within normal limits, speculum exam mild atrophy with no visible discharge or blood.  Bimanual no CMT mild tenderness at suprapubic area. UA: +1 blood, negative nitrites, negative leukocytes, 0-5 WBCs, 3-10 RBCs, 0-5 squamous epithelials, few bacteria  Probable UTI 10 days postop D&C/hysteroscope  Plan: Septra twice daily for 3 days prescription, proper use given and reviewed importance of increasing plain water, UTI prevention discussed.  Instructed to call if symptoms persist.  Urine culture pending.  Keep scheduled appointment with Dr. Dellis Filbert.

## 2019-11-11 NOTE — Patient Instructions (Signed)
Infeccin urinaria en los adultos Urinary Tract Infection, Adult  Una infeccin urinaria (IU) puede ocurrir en cualquier lugar de las vas urinarias. Las vas urinarias incluyen a los riones, los urteres, la vejiga y la uretra. Estos rganos fabrican, almacenan y eliminan la orina del organismo. El mdico puede usar otras palabras para describir la infeccin. La IU alta afecta los urteres y a los riones (pielonefritis). La IU baja afecta la vejiga (cistitis) y la uretra (uretritis). Cules son las causas? La mayora de las infecciones de las vas urinarias es causada por la presencia de bacterias en la zona genital, alrededor de la entrada de las vas urinarias (uretra). Estas bacterias proliferan y causan inflamacin en las vas urinarias. Qu incrementa el riesgo? Es ms probable que contraiga esta afeccin si:  Tiene colocado un catter urinario (sonda urinaria) permanente.  No puede controlar cundo orinar o defecar (tiene incontinencia).  Es mujer y usted: ? Utiliza espermicida o diafragma como mtodo anticonceptivo. ? Tiene niveles bajos de estrgenos. ? Estn embarazadas.  Tiene ciertos genes que aumentan su riesgo (gentica).  Es sexualmente activa.  Toma antibiticos.  Tiene una afeccin que causa que el flujo de orina sea lento, como: ? Prstata agrandada, si usted es hombre. ? Obstruccin de la uretra (estenosis). ? Clculo renal. ? Una afeccin nerviosa que afecta el control de la vejiga (vejiga neurgena). ? No bebe lo suficiente o no orina con frecuencia.  Tiene ciertas enfermedades crnicas, como: ? Diabetes. ? Un sistema que combate las enfermedades (sistemainmunitario) debilitado. ? Anemia drepanoctica. ? Gota. ? Lesin en la mdula espinal. Cules son los signos o los sntomas? Los sntomas de esta afeccin incluyen:  Necesidad inmediata (urgente) de orinar.  Miccin frecuente o eliminacin de pequeas cantidades de orina con frecuencia.  Ardor o  dolor al orinar.  Presencia de sangre en la orina.  Orina con mal olor u olor atpico.  Dificultad para orinar.  Orina turbia.  Secrecin vaginal, si es mujer.  Dolor en el abdomen o en la parte inferior de la espalda. Es posible que tambin tenga:  Vmitos o disminucin del apetito.  Confusin.  Irritabilidad o cansancio.  Fiebre.  Diarrea. El primer sntoma en los adultos mayores puede ser la confusin. En algunos casos, es posible que no tengan sntomas hasta que la infeccin empeore. Cmo se diagnostica? Esta afeccin se diagnostica en funcin de sus antecedentes mdicos y de un examen fsico. Tambin pueden hacerle otras pruebas, incluidas las siguientes:  Anlisis de orina.  Anlisis de sangre.  Pruebas de infecciones de transmisin sexual (ITS). Si ha tenido ms de una infeccin urinaria (IU), se pueden hacer estudios de diagnstico por imgenes o una cistoscopia para determinar la causa de las infecciones. Cmo se trata? El tratamiento de esta afeccin incluye lo siguiente:  Antibiticos.  Medicamentos de venta libre para aliviar las molestias.  Beber una cantidad suficiente agua para mantenerse hidratado. Si tiene infecciones con frecuencia o tiene otras afecciones, como un clculo renal, es posible que deba ver a un mdico especialista en las vas urinarias (urlogo). En casos poco frecuentes, las infecciones urinarias pueden provocar sepsis. La sepsis es una afeccin potencialmente mortal que se produce cuando el cuerpo responde a una infeccin. La sepsis se trata en el hospital con antibiticos, lquidos y otros medicamentos que se administran por va intravenosa. Sigue estas instrucciones en tu casa:  Medicamentos  Toma los medicamentos de venta libre y los recetados solamente como se lo haya indicado el mdico.  Si le recetaron   un antibitico, tmelo como se lo haya indicado el mdico. No deje de usar el antibitico aunque comience a Consulting civil engineer. Indicaciones generales  Asegrese de hacer lo siguiente: ? Vaciar la vejiga con frecuencia y en su totalidad. No contener la orina durante largos perodos. ? Vaciar la vejiga despus de [employer]. ? Limpiarse de adelante hacia atrs despus de defecar, si es mujer. Use cada trozo de papel higinico solo una vez cuando se limpie.  Beba suficiente lquido como para Theatre manager la orina de color amarillo plido.  Concurrir a todas las visitas de seguimiento como se lo haya indicado el mdico. Esto es importante. Comuncate con un mdico si:  Los sntomas no mejoran despus de 1 o 2das de tratamiento.  Los sntomas desaparecen y luego vuelven a Arts administrator. Solicite ayuda inmediatamente si tiene:  Dolor intenso en la espalda o en la parte inferior del abdomen.  Cristy Hilts.  Nuseas o vmitos. Resumen  Una infeccin urinaria (IU) es una infeccin en cualquier parte de las vas urinarias, que [employer] riones, los urteres, la vejiga y Geologist, engineering.  La mayora de las infecciones de las vas urinarias es causada por la presencia de bacterias en la zona genital, alrededor de la entrada de las vas urinarias (uretra).  El tratamiento de esta afeccin suele incluir antibiticos.  Si le recetaron un antibitico, tmelo como se lo haya indicado el mdico. No deje de usar el antibitico aunque comience a Sports administrator.  Concurrir a todas las visitas de seguimiento como se lo haya indicado el mdico. Esto es importante. Esta informacin no tiene Marine scientist el consejo del mdico. Asegrese de hacerle al mdico cualquier pregunta que tenga. Document Released: 08/10/2005 Document Revised: 10/24/2018 Document Reviewed: 10/24/2018 Elsevier Patient Education  2020 [employer].

## 2019-11-13 LAB — URINALYSIS, COMPLETE W/RFL CULTURE
Bilirubin Urine: NEGATIVE
Glucose, UA: NEGATIVE
Hyaline Cast: NONE SEEN /LPF
Ketones, ur: NEGATIVE
Leukocyte Esterase: NEGATIVE
Nitrites, Initial: NEGATIVE
Protein, ur: NEGATIVE
Specific Gravity, Urine: 1.02 (ref 1.001–1.03)
pH: 6 (ref 5.0–8.0)

## 2019-11-13 LAB — URINE CULTURE
MICRO NUMBER:: 1232530
Result:: NO GROWTH
SPECIMEN QUALITY:: ADEQUATE

## 2019-11-13 LAB — CULTURE INDICATED

## 2019-11-21 ENCOUNTER — Other Ambulatory Visit: Payer: Self-pay

## 2019-11-22 ENCOUNTER — Encounter: Payer: Self-pay | Admitting: Obstetrics & Gynecology

## 2019-11-22 ENCOUNTER — Ambulatory Visit (INDEPENDENT_AMBULATORY_CARE_PROVIDER_SITE_OTHER): Payer: Self-pay | Admitting: Obstetrics & Gynecology

## 2019-11-22 VITALS — BP 120/78

## 2019-11-22 DIAGNOSIS — Z09 Encounter for follow-up examination after completed treatment for conditions other than malignant neoplasm: Secondary | ICD-10-CM

## 2019-11-22 NOTE — Progress Notes (Signed)
    Allison Hubbard 03-03-1961 [id number]        59 y.o.  G1P1L1  RP: Postop HSC/Myosure Excision/D+C on 11/01/2019  HPI: Very good postop recovery.  No abdominal pelvic pain.  No vaginal discharge.  No vaginal bleeding.  Urine and bowel movements normal.  No fever.   OB History  Gravida Para Term Preterm AB Living  1 1       1   SAB TAB Ectopic Multiple Live Births               # Outcome Date GA Lbr Len/2nd Weight Sex Delivery Anes PTL Lv  1 Para             Past medical history,surgical history, problem list, medications, allergies, family history and social history were all reviewed and documented in the EPIC chart.   Directed ROS with pertinent positives and negatives documented in the history of present illness/assessment and plan.  Exam:  Vitals:   11/22/19 1425  BP: 120/78   General appearance:  Normal  Abdomen: Normal  Gynecologic exam: Vulva normal.  Bimanual exam: Uterus anteverted, normal volume, mobile, nontender.  No adnexal mass, nontender bilaterally.  FINAL MICROSCOPIC DIAGNOSIS:   A. ENDOMETRIUM, POLYP, CURETTAGE:  - Benign endometrial type polyp.    Assessment/Plan:  59 y.o. G1P1   1. Status post gynecological surgery, follow-up exam Excellent postop recovery and healing.  Normal gynecologic exam.  Patient informed about surgical findings and pictures shown.  Benign pathology reviewed with patient.  Patient reassured.  We will follow-up for her next annual gynecologic exam.  61 MD, 2:33 PM 11/22/2019

## 2019-11-24 ENCOUNTER — Encounter: Payer: Self-pay | Admitting: Obstetrics & Gynecology

## 2019-11-24 NOTE — Patient Instructions (Signed)
1. Status post gynecological surgery, follow-up exam Excellent postop recovery and healing.  Normal gynecologic exam.  Patient informed about surgical findings and pictures shown.  Benign pathology reviewed with patient.  Patient reassured.  We will follow-up for her next annual gynecologic exam.  Ludwika, fue un placer verle hoy!

## 2019-12-05 ENCOUNTER — Ambulatory Visit: Payer: Self-pay | Admitting: Gynecology

## 2020-12-19 ENCOUNTER — Other Ambulatory Visit: Payer: Self-pay

## 2020-12-19 ENCOUNTER — Encounter (HOSPITAL_BASED_OUTPATIENT_CLINIC_OR_DEPARTMENT_OTHER): Payer: Self-pay | Admitting: Emergency Medicine

## 2020-12-19 ENCOUNTER — Emergency Department (HOSPITAL_BASED_OUTPATIENT_CLINIC_OR_DEPARTMENT_OTHER): Payer: Self-pay

## 2020-12-19 ENCOUNTER — Emergency Department (HOSPITAL_BASED_OUTPATIENT_CLINIC_OR_DEPARTMENT_OTHER)
Admission: EM | Admit: 2020-12-19 | Discharge: 2020-12-19 | Disposition: A | Discharge status: Home / Self Care | Payer: Self-pay | Attending: Emergency Medicine | Admitting: Emergency Medicine

## 2020-12-19 DIAGNOSIS — H538 Other visual disturbances: Secondary | ICD-10-CM | POA: Insufficient documentation

## 2020-12-19 DIAGNOSIS — R42 Dizziness and giddiness: Secondary | ICD-10-CM | POA: Insufficient documentation

## 2020-12-19 DIAGNOSIS — Z79899 Other long term (current) drug therapy: Secondary | ICD-10-CM | POA: Insufficient documentation

## 2020-12-19 DIAGNOSIS — Z87891 Personal history of nicotine dependence: Secondary | ICD-10-CM | POA: Insufficient documentation

## 2020-12-19 DIAGNOSIS — R519 Headache, unspecified: Secondary | ICD-10-CM | POA: Insufficient documentation

## 2020-12-19 DIAGNOSIS — I10 Essential (primary) hypertension: Secondary | ICD-10-CM | POA: Insufficient documentation

## 2020-12-19 LAB — CBC WITH DIFFERENTIAL/PLATELET
Abs Immature Granulocytes: 0.02 10*3/uL (ref 0.00–0.07)
Basophils Absolute: 0 10*3/uL (ref 0.0–0.1)
Basophils Relative: 1 %
Eosinophils Absolute: 0.5 10*3/uL (ref 0.0–0.5)
Eosinophils Relative: 6 %
HCT: 38.8 % (ref 36.0–46.0)
Hemoglobin: 13.1 g/dL (ref 12.0–15.0)
Immature Granulocytes: 0 %
Lymphocytes Relative: 25 %
Lymphs Abs: 1.9 10*3/uL (ref 0.7–4.0)
MCH: 29.1 pg (ref 26.0–34.0)
MCHC: 33.8 g/dL (ref 30.0–36.0)
MCV: 86.2 fL (ref 80.0–100.0)
Monocytes Absolute: 0.5 10*3/uL (ref 0.1–1.0)
Monocytes Relative: 6 %
Neutro Abs: 4.7 10*3/uL (ref 1.7–7.7)
Neutrophils Relative %: 62 %
Platelets: 218 10*3/uL (ref 150–400)
RBC: 4.5 MIL/uL (ref 3.87–5.11)
RDW: 12.9 % (ref 11.5–15.5)
WBC: 7.6 10*3/uL (ref 4.0–10.5)
nRBC: 0 % (ref 0.0–0.2)

## 2020-12-19 LAB — BASIC METABOLIC PANEL
Anion gap: 12 (ref 5–15)
BUN: 11 mg/dL (ref 6–20)
CO2: 25 mmol/L (ref 22–32)
Calcium: 9.5 mg/dL (ref 8.9–10.3)
Chloride: 101 mmol/L (ref 98–111)
Creatinine, Ser: 0.63 mg/dL (ref 0.44–1.00)
GFR, Estimated: >60 mL/min (ref >60)
Glucose, Bld: 128 mg/dL — ABNORMAL HIGH (ref 70–99)
Potassium: 3.6 mmol/L (ref 3.5–5.1)
Sodium: 138 mmol/L (ref 135–145)

## 2020-12-19 LAB — TROPONIN I (HIGH SENSITIVITY): Troponin I (High Sensitivity): <2 ng/L (ref <18)

## 2020-12-19 MED ORDER — MECLIZINE HCL 25 MG PO TABS: 25.0000 mg | ORAL_TABLET | Freq: Three times a day (TID) | ORAL | 0 refills | Status: DC | PRN

## 2020-12-19 MED ORDER — MECLIZINE HCL 25 MG PO TABS
25.0000 mg | ORAL_TABLET | Freq: Once | ORAL | Status: AC
  Administered 2020-12-19: 25 mg via ORAL
  Filled 2020-12-19: qty 1

## 2020-12-19 NOTE — ED Provider Notes (Signed)
[facility] EMERGENCY DEPARTMENT Provider Note   CSN: [id number] Arrival date & time: 12/19/20  6063     History Chief Complaint  Patient presents with  . Dizziness    Allison Hubbard is a 60 y.o. female.  Presents to ER with concern for dizziness.  Patient reports that on Tuesday she had an episode of dizziness as well as Thursday and again this morning.  All episodes occurred in the morning after waking up.  Lasting around 20 minutes.  Described as a room spinning sensation.  Was able to walk albeit with some difficulty.  Yesterday and today has also had a dull achy headache.  Mild, frontal.  Has not taken any medication.  On Tuesday he also had sensation of blurry vision during the episode.  Has not had any recurrent visual episodes and has no change in vision now.  Today she also noted slight discomfort on the left side of her chest that was associated with this episode.  This is since resolved.  Mild, nonradiating.  Reports a history of hypertension but denies any other medical problems.  HPI     Past Medical History:  Diagnosis Date  . Complication of anesthesia 1994 may   had feeling with c section surgery  . Depression    7 years ago. no problems now.  . Gastritis   . Hypertension   . Postmenopausal bleeding     Patient Active Problem List   Diagnosis Date Noted  . Essential hypertension 12/01/2015  . Chest pain 12/01/2015  . Perimenopause 07/28/2014    Past Surgical History:  Procedure Laterality Date  . CESAREAN SECTION    . DILATATION & CURETTAGE/HYSTEROSCOPY WITH MYOSURE N/A 11/01/2019   Procedure: DILATATION & CURETTAGE/HYSTEROSCOPY WITH MYOSURE;  Surgeon: Genia Del, MD;  Location: [facility];  Service: Gynecology;  Laterality: N/A;  requesting 7:30am OR start in [place] [facility] block requests 30 minutes OR time.     OB History    Gravida  1   Para  1   Term      Preterm      AB      Living  1     SAB       IAB      Ectopic      Multiple      Live Births              Family History  Problem Relation Age of Onset  . Diabetes Mother   . Diabetes Sister     Social History   Tobacco Use  . Smoking status: Former Smoker    Types: Cigarettes  . Smokeless tobacco: Never Used  . Tobacco comment: quit 2000. Smoked 1 cigarette per day  Vaping Use  . Vaping Use: Never used  Substance Use Topics  . Alcohol use: Not Currently    Alcohol/week: 0.0 standard drinks  . Drug use: No    Home Medications Prior to Admission medications   Medication Sig Start Date End Date Taking? Authorizing Provider  meclizine (ANTIVERT) 25 MG tablet Take 1 tablet (25 mg total) by mouth 3 (three) times daily as needed for dizziness. 12/19/20  Yes Milagros Loll, MD  Aloe Vera 25 MG CAPS Take by mouth.    [provider]  Ascorbic Acid (VITAMIN C) 100 MG tablet Take 100 mg by mouth daily.    [provider]  Bee Pollen 1000 MG TABS Take by mouth.    [provider]  lisinopril (ZESTRIL) 20 MG tablet Take 20 mg by mouth daily.    [provider]  Omega-3 Fatty Acids (FISH OIL) 1000 MG CAPS Take 1 capsule by mouth daily.     [provider]    Allergies    Patient has no known allergies.  Review of Systems   Review of Systems  Constitutional: Negative for chills and fever.  HENT: Negative for ear pain and sore throat.   Eyes: Positive for visual disturbance. Negative for pain.  Respiratory: Negative for cough and shortness of breath.   Cardiovascular: Negative for chest pain and palpitations.  Gastrointestinal: Negative for abdominal pain and vomiting.  Genitourinary: Negative for dysuria and hematuria.  Musculoskeletal: Negative for arthralgias and back pain.  Skin: Negative for color change and rash.  Neurological: Positive for dizziness, light-headedness and headaches. Negative for seizures and syncope.  All other systems reviewed and are  negative.   Physical Exam Updated Vital Signs BP 128/68   Pulse (!) 52   Temp 98.5 F (36.9 C) (Oral)   Resp (!) 22   Ht 4\' 11"  (1.499 m)   Wt 70.3 kg   LMP 07/31/2016   SpO2 99%   BMI 31.31 kg/m   Physical Exam Vitals and nursing note reviewed.  Constitutional:      General: She is not in acute distress.    Appearance: She is well-developed and well-nourished.  HENT:     Head: Normocephalic and atraumatic.     Right Ear: Tympanic membrane, ear canal and external ear normal.     Left Ear: Tympanic membrane, ear canal and external ear normal.     Nose: Nose normal.  Eyes:     Conjunctiva/sclera: Conjunctivae normal.  Cardiovascular:     Rate and Rhythm: Normal rate and regular rhythm.     Heart sounds: No murmur heard.   Pulmonary:     Effort: Pulmonary effort is normal. No respiratory distress.     Breath sounds: Normal breath sounds.  Abdominal:     Palpations: Abdomen is soft.     Tenderness: There is no abdominal tenderness.  Musculoskeletal:        General: No edema.     Cervical back: Neck supple.  Skin:    General: Skin is warm and dry.     Capillary Refill: Capillary refill takes less than 2 seconds.  Neurological:     Mental Status: She is alert.     Comments: AAOx3 CN 2-12 intact, speech clear visual fields intact 5/5 strength in b/l UE and LE Sensation to light touch intact in b/l UE and LE Normal FNF Normal gait  Psychiatric:        Mood and Affect: Mood and affect and mood normal.        Behavior: Behavior normal.     ED Results / Procedures / Treatments   Labs (all labs ordered are listed, but only abnormal results are displayed) Labs Reviewed  BASIC METABOLIC PANEL - Abnormal; Notable for the following components:      Result Value   Glucose, Bld 128 (*)    All other components within normal limits  CBC WITH DIFFERENTIAL/PLATELET  TROPONIN I (HIGH SENSITIVITY)    EKG EKG Interpretation  Date/Time:  Saturday December 19 2020  08:02:06 EST Ventricular Rate:  55 PR Interval:    QRS Duration: 78 QT Interval:  465 QTC Calculation: 445 R Axis:   -39 Text Interpretation: Sinus rhythm Left axis deviation Low voltage, precordial  leads Borderline T abnormalities, anterior leads no acute changes Confirmed by Marianna Fuss ([id number]) on 12/19/2020 8:46:13 AM   Radiology CT Head Wo Contrast  Result Date: 12/19/2020 CLINICAL DATA:  Vertigo and blurry vision. History of hypertension. Headache. EXAM: CT HEAD WITHOUT CONTRAST TECHNIQUE: Contiguous axial images were obtained from the base of the skull through the vertex without intravenous contrast. COMPARISON:  None. FINDINGS: Brain: No evidence of acute infarction, hemorrhage, hydrocephalus, extra-axial collection or mass lesion/mass effect. Vascular: No hyperdense vessel or unexpected calcification. Skull: Normal. Negative for fracture or focal lesion. Sinuses/Orbits: No acute finding. IMPRESSION: Negative head CT. Electronically Signed   By: Marnee Spring M.D.   On: 12/19/2020 09:16    Procedures Procedures   Medications Ordered in ED Medications  meclizine (ANTIVERT) tablet 25 mg (25 mg Oral Given 12/19/20 0845)    ED Course  I have reviewed the triage vital signs and the nursing notes.  Pertinent labs & imaging results that were available during my care of the patient were reviewed by me and considered in my medical decision making (see chart for details).    MDM Rules/Calculators/A&P                         60 year old lady presenting to ER with concern for dizziness.  Multiple episodes over the last few days.  In ER, well-appearing in no acute distress.  Normal neurologic exam.  Low clinical suspicion for stroke at this time or central etiology.  Given the associated headache, did check CT head which was negative.  EKG without acute ischemic change and troponin undetectable, doubt ACS.  Given the positional, episodic nature, suspect most likely BPPV.  On reassessment  patient had no ongoing symptoms and remained well-appearing.  Will discharge home and recommended follow-up with PCP.   After the discussed management above, the patient was determined to be safe for discharge.  The patient was in agreement with this plan and all questions regarding their care were answered.  ED return precautions were discussed and the patient will return to the ED with any significant worsening of condition.   Final Clinical Impression(s) / ED Diagnoses Final diagnoses:  Vertigo    Rx / DC Orders ED Discharge Orders         Ordered    meclizine (ANTIVERT) 25 MG tablet  3 times daily PRN        12/19/20 0923           Milagros Loll, MD 12/19/20 [phone]

## 2020-12-19 NOTE — ED Triage Notes (Signed)
Pt arrives pov with driver, c/o HA and dizziness x 5 days. Pt bilaterally equal, feels slightly less on right side of face. VAN neg. Pt endorse Covid on 11/18/20

## 2020-12-19 NOTE — ED Notes (Signed)
Pt discharged to home. Discharge instructions have been discussed with patient and/or family members. Pt verbally acknowledges understanding d/c instructions, and endorses comprehension to checkout at registration before leaving.  °

## 2020-12-19 NOTE — Discharge Instructions (Signed)
Take meclizine as needed for vertigo.  If you have worsening episodes, or ever have a constant room spinning sensation, difficulty walking, any numbness, weakness, speech or vision changes, any chest pain, return to ER for reassessment.  I would recommend following up with your primary doctor next week.

## 2020-12-21 ENCOUNTER — Encounter: Payer: Self-pay | Admitting: Obstetrics & Gynecology

## 2020-12-21 ENCOUNTER — Ambulatory Visit (INDEPENDENT_AMBULATORY_CARE_PROVIDER_SITE_OTHER): Payer: Self-pay | Admitting: Obstetrics & Gynecology

## 2020-12-21 ENCOUNTER — Telehealth: Payer: Self-pay | Admitting: *Deleted

## 2020-12-21 ENCOUNTER — Other Ambulatory Visit: Payer: Self-pay

## 2020-12-21 VITALS — BP 124/80 | Ht <= 58 in | Wt 161.0 lb

## 2020-12-21 DIAGNOSIS — Z780 Asymptomatic menopausal state: Secondary | ICD-10-CM

## 2020-12-21 DIAGNOSIS — Z01419 Encounter for gynecological examination (general) (routine) without abnormal findings: Secondary | ICD-10-CM

## 2020-12-21 DIAGNOSIS — Z1211 Encounter for screening for malignant neoplasm of colon: Secondary | ICD-10-CM

## 2020-12-21 DIAGNOSIS — Z1231 Encounter for screening mammogram for malignant neoplasm of breast: Secondary | ICD-10-CM

## 2020-12-21 DIAGNOSIS — Z6834 Body mass index (BMI) 34.0-34.9, adult: Secondary | ICD-10-CM

## 2020-12-21 DIAGNOSIS — E6609 Other obesity due to excess calories: Secondary | ICD-10-CM

## 2020-12-21 NOTE — Telephone Encounter (Signed)
Patient is uninsured will send message to [place] at the [facility] and they will call patient to discuss for screening mammogram.  Referral placed at [facility] for screening colonoscopy. They will call to schedule.

## 2020-12-21 NOTE — Progress Notes (Signed)
Allison Hubbard [birth year] [id number]   History:    60 y.o. G1P1L1   RP:  Established patient presenting for annual gyn exam   HPI: S/P HSC/Myosure Excision/D+C on 11/01/2019.  Postmenopause.  Well on no HRT.  No PMB.  No pelvic pain.  Breasts normal.  Had pain from the compression with last screening mammo in 2012.  Never had a Colonoscopy.  BMI 34.84.  Started a lower calorie/carb diet.  12/19/2020 CMP/CBC for Vertigo.  Past medical history,surgical history, family history and social history were all reviewed and documented in the EPIC chart.  Gynecologic History Patient's last menstrual period was 07/31/2016.  Obstetric History OB History  Gravida Para Term Preterm AB Living  1 1     0 1  SAB IAB Ectopic Multiple Live Births  0   0        # Outcome Date GA Lbr Len/2nd Weight Sex Delivery Anes PTL Lv  1 Para              ROS: A ROS was performed and pertinent positives and negatives are included in the history.  GENERAL: No fevers or chills. HEENT: No change in vision, no earache, sore throat or sinus congestion. NECK: No pain or stiffness. CARDIOVASCULAR: No chest pain or pressure. No palpitations. PULMONARY: No shortness of breath, cough or wheeze. GASTROINTESTINAL: No abdominal pain, nausea, vomiting or diarrhea, melena or bright red blood per rectum. GENITOURINARY: No urinary frequency, urgency, hesitancy or dysuria. MUSCULOSKELETAL: No joint or muscle pain, no back pain, no recent trauma. DERMATOLOGIC: No rash, no itching, no lesions. ENDOCRINE: No polyuria, polydipsia, no heat or cold intolerance. No recent change in weight. HEMATOLOGICAL: No anemia or easy bruising or bleeding. NEUROLOGIC: No headache, seizures, numbness, tingling or weakness. PSYCHIATRIC: No depression, no loss of interest in normal activity or change in sleep pattern.     Exam:   BP 124/80 (BP Location: Right Arm, Patient Position: Sitting, Cuff Size: Normal)   Ht 4\' 9"  (1.448 m)   Wt 161 lb (73 kg)    LMP 07/31/2016   BMI 34.84 kg/m   Body mass index is 34.84 kg/m.  General appearance : Well developed well nourished female. No acute distress HEENT: Eyes: no retinal hemorrhage or exudates,  Neck supple, trachea midline, no carotid bruits, no thyroidmegaly Lungs: Clear to auscultation, no rhonchi or wheezes, or rib retractions  Heart: Regular rate and rhythm, no murmurs or gallops Breast:Examined in sitting and supine position were symmetrical in appearance, no palpable masses or tenderness,  no skin retraction, no nipple inversion, no nipple discharge, no skin discoloration, no axillary or supraclavicular lymphadenopathy Abdomen: no palpable masses or tenderness, no rebound or guarding Extremities: no edema or skin discoloration or tenderness  Pelvic: Vulva: Normal             Vagina: No gross lesions or discharge  Cervix: No gross lesions or discharge.  Pap reflex done.  Uterus AV, normal size, shape and consistency, non-tender and mobile  Adnexa  Without masses or tenderness  Anus: Normal   Assessment/Plan:  60 y.o. female for annual exam   1. Encounter for routine gynecological examination with Papanicolaou smear of cervix Normal gynecologic exam in menopause.  Pap reflex done.  Breast exam normal.  Overdue for a screening mammogram, patient declined after counseling.  Will refer to gastro for a screening colonoscopy.  Declines health labs.  CBC and CMP with no significant abnormalities on December 19, 2020 when presented at  [facility] for vertigo.  Blood glucose was high at 128 but patient was not fasting. - Pap IG w/ reflex to HPV when ASC-U  2. Postmenopause Well on no hormone replacement therapy.  No postmenopausal bleeding.  Vitamin D supplements, calcium intake of 1500 mg total and regular weightbearing physical activity is recommended.  3. Class 1 obesity due to excess calories without serious comorbidity with body mass index (BMI) of 34.0 to 34.9 in adult Recommend  to continue on a lower calorie/carb diet.  Aerobic activities 5 times a week and light weightlifting every 2 days.  Genia Del MD, 11:22 AM 12/21/2020

## 2020-12-21 NOTE — Telephone Encounter (Signed)
-----   Message from Jerilynn Mages sent at 12/21/2020 11:55 AM EST ----- Regarding: FW: Refer to Laurette Schimke  ----- Message ----- From: Genia Del, MD Sent: 12/21/2020  11:39 AM EST To: [facility] Subject: Refer to Laurette Schimke                                Screening mammo

## 2020-12-23 LAB — PAP IG W/ RFLX HPV ASCU

## 2020-12-29 NOTE — Telephone Encounter (Signed)
[facility] called patient and patient declined to schedule mammogram screening appointment.

## 2022-03-21 ENCOUNTER — Other Ambulatory Visit (HOSPITAL_COMMUNITY)
Admission: RE | Admit: 2022-03-21 | Discharge: 2022-03-21 | Disposition: A | Discharge status: Home / Self Care | Payer: Self-pay | Source: Ambulatory Visit | Attending: Obstetrics & Gynecology | Admitting: Obstetrics & Gynecology

## 2022-03-21 ENCOUNTER — Ambulatory Visit (INDEPENDENT_AMBULATORY_CARE_PROVIDER_SITE_OTHER): Payer: Self-pay | Admitting: Obstetrics & Gynecology

## 2022-03-21 ENCOUNTER — Encounter: Payer: Self-pay | Admitting: Obstetrics & Gynecology

## 2022-03-21 VITALS — BP 122/92 | HR 63 | Ht <= 58 in | Wt 165.0 lb

## 2022-03-21 DIAGNOSIS — E66812 Obesity, class 2: Secondary | ICD-10-CM

## 2022-03-21 DIAGNOSIS — Z01419 Encounter for gynecological examination (general) (routine) without abnormal findings: Secondary | ICD-10-CM

## 2022-03-21 DIAGNOSIS — Z6835 Body mass index (BMI) 35.0-35.9, adult: Secondary | ICD-10-CM

## 2022-03-21 DIAGNOSIS — E6609 Other obesity due to excess calories: Secondary | ICD-10-CM

## 2022-03-21 DIAGNOSIS — Z780 Asymptomatic menopausal state: Secondary | ICD-10-CM

## 2022-03-21 NOTE — Progress Notes (Signed)
? ? ?Allison Hubbard [birth year] [id number] ? ? ?History:    61 y.o. G1P1L1 Single ?  ?RP:  Established patient presenting for annual gyn exam  ?  ?HPI: Postmenopause.  Well on no HRT.  No PMB.  No pelvic pain. Abstinent.  Pap 02/2021 Neg.  No h/o abnormal Pap.  Wants Paps annually, Pap reflex done.  Breasts normal.  Had pain from the compression with last screening mammo in 2012, refuses Mammo.  Never had a Colonoscopy.  BMI 35.39.  Reports back pain and lower abdominal pain 2 weeks ago after heavy lifting, wearing a support garment which helps, mild LLQ pain remains.  Health labs. ? ? ?Past medical history,surgical history, family history and social history were all reviewed and documented in the EPIC chart. ? ?Gynecologic History ?Patient's last menstrual period was 07/31/2016. ? ?Obstetric History ?OB History  ?Gravida Para Term Preterm AB Living  ?1 1     0 1  ?SAB IAB Ectopic Multiple Live Births  ?0   0      ?  ?# Outcome Date GA Lbr Len/2nd Weight Sex Delivery Anes PTL Lv  ?1 Para           ? ? ? ?ROS: A ROS was performed and pertinent positives and negatives are included in the history. ? GENERAL: No fevers or chills. HEENT: No change in vision, no earache, sore throat or sinus congestion. NECK: No pain or stiffness. CARDIOVASCULAR: No chest pain or pressure. No palpitations. PULMONARY: No shortness of breath, cough or wheeze. GASTROINTESTINAL: No abdominal pain, nausea, vomiting or diarrhea, melena or bright red blood per rectum. GENITOURINARY: No urinary frequency, urgency, hesitancy or dysuria. MUSCULOSKELETAL: No joint or muscle pain, no back pain, no recent trauma. DERMATOLOGIC: No rash, no itching, no lesions. ENDOCRINE: No polyuria, polydipsia, no heat or cold intolerance. No recent change in weight. HEMATOLOGICAL: No anemia or easy bruising or bleeding. NEUROLOGIC: No headache, seizures, numbness, tingling or weakness. PSYCHIATRIC: No depression, no loss of interest in normal activity or change in  sleep pattern.  ?  ? ?Exam: ? ? ?BP (!) 122/92   Pulse 63   Ht 4' 9.25" (1.454 m)   Wt 165 lb (74.8 kg)   LMP 07/31/2016   SpO2 98%   BMI 35.39 kg/m?  ? ?Body mass index is 35.39 kg/m?. ? ?General appearance : Well developed well nourished female. No acute distress ?HEENT: Eyes: no retinal hemorrhage or exudates,  Neck supple, trachea midline, no carotid bruits, no thyroidmegaly ?Lungs: Clear to auscultation, no rhonchi or wheezes, or rib retractions  ?Heart: Regular rate and rhythm, no murmurs or gallops ?Breast:Examined in sitting and supine position were symmetrical in appearance, no palpable masses or tenderness,  no skin retraction, no nipple inversion, no nipple discharge, no skin discoloration, no axillary or supraclavicular lymphadenopathy ?Abdomen: no palpable masses or tenderness, no rebound or guarding ?Extremities: no edema or skin discoloration or tenderness ? ?Pelvic: Vulva: Normal ?            Vagina: No gross lesions or discharge ? Cervix: No gross lesions or discharge.  Pap reflex done. ? Uterus  AV, normal size, shape and consistency, non-tender and mobile ? Adnexa  Without masses or tenderness ? Anus: Normal ? ? ?Assessment/Plan:  61 y.o. female for annual exam  ? ?1. Encounter for routine gynecological examination with Papanicolaou smear of cervix ?Postmenopause.  Well on no HRT.  No PMB.  No pelvic pain. Abstinent.  Pap 02/2021 Neg.  No h/o abnormal Pap.  Wants Paps annually, Pap reflex done.  Breasts normal.  Had pain from the compression with last screening mammo in 2012, refuses Mammo.  Never had a Colonoscopy.  BMI 35.39.  Reports back pain and lower abdominal pain 2 weeks ago after heavy lifting, wearing a support garment which helps, mild LLQ pain remains.  Health labs with Fam MD. ?- Cytology - PAP( ) ? ?2. Postmenopause ?Postmenopause.  Well on no HRT.  No PMB.  No pelvic pain. Abstinent.  ? ?3. Class 2 obesity due to excess calories without serious comorbidity with body  mass index (BMI) of 35.0 to 35.9 in adult  ?BMI 35.39.  Recommend lower calorie/carb diet.  Increase physical activities. ? ?Genia Del MD, 11:06 AM 03/21/2022 ? ?  ?

## 2022-03-22 LAB — CYTOLOGY - PAP: Diagnosis: NEGATIVE

## 2023-01-10 NOTE — Progress Notes (Signed)
  Cardiology Office Note:    Date:  01/25/2023   ID:  Allison Hubbard, DOB [birth year], MRN [id number]  PCP:  Patient, No Pcp Per   [name], MD Referring MD: [name], NP   Chief Complaint/Reason for Referral:  Chest pain  PATIENT DID NOT APPEAR FOR APPOINTMENT   ASSESSMENT:    1. Precordial pain   2. Primary hypertension     PLAN:    In order of problems listed above: 1.  Chest pain:  We will obtain a coronary CTA and echocardiogram to evaluate further.  If the patient has mild obstructive coronary artery disease, they will require a statin (with goal LDL < 70) and aspirin, if they have high-grade disease we will need to consider optimal medical therapy and if symptoms are refractory to medical therapy, then a cardiac catheterization with possible PCI will be pursued to alleviate symptoms.  If they have high risk disease we will proceed directly to cardiac catheterization.   2.  Hypertension:        History of Present Illness:    FOCUSED PROBLEM LIST:   1.  Hypertension 2.  BMI 35 3.  Spanish-speaking  The patient is a 62 y.o. female with the indicated medical history here for chest pain.  The patient was seen by her primary care provider recently due to a sore throat.  At that visit she reported chest discomfort.  EKG was done which showed no acute ischemic changes specifically demonstrated sinus bradycardia.  She is referred for cardiology evaluation.          Current Medications: No outpatient medications have been marked as taking for the 01/20/23 encounter (Office Visit) with Early Osmond, MD.     Allergies:    Patient has no known allergies.   Social History:   Social History   Tobacco Use   Smoking status: Former    Types: Cigarettes   Smokeless tobacco: Former    Quit date: 12/21/1998   Tobacco comments:    quit 2000. Smoked 1 cigarette per day  Vaping Use   Vaping Use: Never used   Substance Use Topics   Alcohol use: Not Currently   Drug use: No     Family Hx: Family History  Problem Relation Age of Onset   Diabetes Mother    Diabetes Sister      Review of Systems:   Please see the history of present illness.    All other systems reviewed and are negative.     EKGs/Labs/Other Test Reviewed:    EKG:  EKG performed February 2024 that I personally reviewed demonstrates sinus bradycardia  Prior CV studies: None available  Other studies Reviewed: Review of the additional studies/records demonstrates: Review of available imaging studies do not demonstrate aortic atherosclerosis or coronary artery calcification  Recent Labs: No results found for requested labs within last 365 days.   Recent Lipid Panel No results found for: "CHOL", "TRIG", "HDL", "[place]", "LDLDIRECT"  Risk Assessment/Calculations:      Physical Exam:   Signed, Early Osmond, MD  01/25/2023 2:12 PM    [facility] [address], [place], [place]  [address] Phone: [phone]; Fax: [phone]   Note:  This document was prepared using Dragon voice recognition software and may include unintentional dictation errors.

## 2023-01-20 ENCOUNTER — Ambulatory Visit: Payer: Self-pay | Admitting: Internal Medicine

## 2023-01-20 DIAGNOSIS — I10 Essential (primary) hypertension: Secondary | ICD-10-CM

## 2023-01-20 DIAGNOSIS — R072 Precordial pain: Secondary | ICD-10-CM

## 2023-08-18 ENCOUNTER — Emergency Department (HOSPITAL_COMMUNITY)
Admission: EM | Admit: 2023-08-18 | Discharge: 2023-08-18 | Disposition: A | Discharge status: Home / Self Care | Payer: Self-pay | Attending: Emergency Medicine | Admitting: Emergency Medicine

## 2023-08-18 ENCOUNTER — Emergency Department (HOSPITAL_COMMUNITY): Payer: Self-pay

## 2023-08-18 ENCOUNTER — Other Ambulatory Visit: Payer: Self-pay

## 2023-08-18 ENCOUNTER — Encounter (HOSPITAL_COMMUNITY): Payer: Self-pay

## 2023-08-18 ENCOUNTER — Other Ambulatory Visit (HOSPITAL_COMMUNITY): Payer: Self-pay

## 2023-08-18 DIAGNOSIS — Z79899 Other long term (current) drug therapy: Secondary | ICD-10-CM | POA: Insufficient documentation

## 2023-08-18 DIAGNOSIS — I10 Essential (primary) hypertension: Secondary | ICD-10-CM | POA: Insufficient documentation

## 2023-08-18 DIAGNOSIS — R079 Chest pain, unspecified: Secondary | ICD-10-CM | POA: Insufficient documentation

## 2023-08-18 LAB — CBC
HCT: 37.8 % (ref 36.0–46.0)
Hemoglobin: 12.6 g/dL (ref 12.0–15.0)
MCH: 29.5 pg (ref 26.0–34.0)
MCHC: 33.3 g/dL (ref 30.0–36.0)
MCV: 88.5 fL (ref 80.0–100.0)
Platelets: 203 10*3/uL (ref 150–400)
RBC: 4.27 MIL/uL (ref 3.87–5.11)
RDW: 13.1 % (ref 11.5–15.5)
WBC: 7.5 10*3/uL (ref 4.0–10.5)
nRBC: 0 % (ref 0.0–0.2)

## 2023-08-18 LAB — TROPONIN I (HIGH SENSITIVITY)
Troponin I (High Sensitivity): 3 ng/L (ref <18)
Troponin I (High Sensitivity): 3 ng/L (ref <18)

## 2023-08-18 LAB — BASIC METABOLIC PANEL
Anion gap: 12 (ref 5–15)
BUN: 14 mg/dL (ref 6–20)
CO2: 23 mmol/L (ref 22–32)
Calcium: 9.2 mg/dL (ref 8.9–10.3)
Chloride: 103 mmol/L (ref 98–111)
Creatinine, Ser: 0.86 mg/dL (ref 0.44–1.00)
GFR, Estimated: >60 mL/min (ref >60)
Glucose, Bld: 133 mg/dL — ABNORMAL HIGH (ref 70–99)
Potassium: 4 mmol/L (ref 3.5–5.1)
Sodium: 138 mmol/L (ref 135–145)

## 2023-08-18 MED ORDER — KETOROLAC TROMETHAMINE 15 MG/ML IJ SOLN
15.0000 mg | Freq: Once | INTRAMUSCULAR | Status: AC
  Administered 2023-08-18: 15 mg via INTRAMUSCULAR
  Filled 2023-08-18: qty 1

## 2023-08-18 NOTE — ED Triage Notes (Signed)
Patient reports left chest pain with SOB this evening , no emesis or diaphoresis .

## 2023-08-18 NOTE — ED Provider Notes (Signed)
[place] EMERGENCY DEPARTMENT AT [facility] Provider Note   CSN: [id number] Arrival date & time: 08/18/23  0242     History  Chief Complaint  Patient presents with   Chest Pain    Allison Hubbard is a 62 y.o. female.  62 y/o female presents to the ED for evaluation of chest pain. Symptoms began 3 hours ago and have been intermittent, waxing/waning. Describes pain as sharp, nonradiating. It is not aggravated by breathing, though she does report associated SOB. Pain improves a bit with activity. She had some relief of her discomfort with naturopathic medications and Omega pills. Denies fevers, hemoptysis, leg swelling, vomiting, diaphoresis, syncope. No known FHx of cardiac disease/ACS. She has experienced similar pain in the past and was referred to Cardiology, but was a no-show for her appt 7 months ago in March.  The history is provided by the patient. A language interpreter was used (language line).  Chest Pain      Home Medications Prior to Admission medications   Medication Sig Start Date End Date Taking? Authorizing Provider  Aloe Vera 25 MG CAPS Take by mouth.    [provider]  Ascorbic Acid (VITAMIN C) 100 MG tablet Take 100 mg by mouth daily.    [provider]  Bee Pollen 1000 MG TABS Take by mouth.    [provider]  lisinopril (ZESTRIL) 20 MG tablet Take 20 mg by mouth daily.    [provider]  meclizine (ANTIVERT) 25 MG tablet Take 1 tablet (25 mg total) by mouth 3 (three) times daily as needed for dizziness. 12/19/20   Milagros Loll, MD  Omega-3 Fatty Acids (FISH OIL) 1000 MG CAPS Take 1 capsule by mouth daily.     [provider]      Allergies    Patient has no known allergies.    Review of Systems   Review of Systems  Cardiovascular:  Positive for chest pain.  Ten systems reviewed and are negative for acute change, except as noted in the HPI.    Physical Exam Updated Vital Signs BP (!)  154/98 (BP Location: Right Arm)   Pulse 63   Temp 98.1 F (36.7 C) (Oral)   Resp (!) 26   Ht 4' 6.72" (1.39 m)   Wt 72.6 kg   LMP 07/31/2016   SpO2 100%   BMI 37.56 kg/m   Physical Exam Vitals and nursing note reviewed.  Constitutional:      General: She is not in acute distress.    Appearance: She is well-developed. She is not diaphoretic.     Comments: Nontoxic appearing and in NAD  HENT:     Head: Normocephalic and atraumatic.  Eyes:     General: No scleral icterus.    Extraocular Movements: EOM normal.     Conjunctiva/sclera: Conjunctivae normal.  Neck:     Comments: No JVD Cardiovascular:     Rate and Rhythm: Normal rate and regular rhythm.     Pulses: Normal pulses.  Pulmonary:     Effort: Pulmonary effort is normal. No respiratory distress.     Breath sounds: No stridor. No wheezing.     Comments: Lungs CTAB. Respirations even and unlabored. Musculoskeletal:        General: Normal range of motion.     Cervical back: Normal range of motion.     Comments: No BLE edema.  Skin:    General: Skin is warm and dry.     Coloration:  Skin is not pale.     Findings: No erythema or rash.  Neurological:     Mental Status: She is alert and oriented to person, place, and time.     Coordination: Coordination normal.  Psychiatric:        Mood and Affect: Mood and affect normal.        Behavior: Behavior normal.     ED Results / Procedures / Treatments   Labs (all labs ordered are listed, but only abnormal results are displayed) Labs Reviewed  BASIC METABOLIC PANEL - Abnormal; Notable for the following components:      Result Value   Glucose, Bld 133 (*)    All other components within normal limits  CBC  TROPONIN I (HIGH SENSITIVITY)  TROPONIN I (HIGH SENSITIVITY)    EKG   Radiology DG Chest 2 View  Result Date: 08/18/2023 CLINICAL DATA:  Left-sided chest pain with shortness of breath. EXAM: CHEST - 2 VIEW COMPARISON:  06/16/2017 FINDINGS: Cardiac enlargement.  Lungs are clear. No pleural effusions. No pneumothorax. Mediastinal contours appear intact. IMPRESSION: Cardiac enlargement.  No evidence of active pulmonary disease. Electronically Signed   By: Burman Nieves M.D.   On: 08/18/2023 03:28    Procedures Procedures    Medications Ordered in ED Medications  ketorolac (TORADOL) 15 MG/ML injection 15 mg (15 mg Intramuscular Given 08/18/23 0404)    ED Course/ Medical Decision Making/ A&P Clinical Course as of 08/18/23 0556  Fri Aug 18, 2023  0551 Pain has improved since arrival. Patient resting comfortably. VSS. [KH]    Clinical Course User Index [KH] Antony Madura, PA-C                                 Medical Decision Making Amount and/or Complexity of Data Reviewed Labs: ordered. Radiology: ordered.  Risk Prescription drug management.   This patient presents to the ED for concern of chest pain, this involves an extensive number of treatment options, and is a complaint that carries with it a high risk of complications and morbidity.  The differential diagnosis includes ACS vs MSK vs PTX vs PNA vs pleural effusion vs anxiety vs gastritis/PUD vs esophageal spasm.   Co morbidities that complicate the patient evaluation  HTN Gastritis Depression   Additional history obtained:  Additional history obtained from family at bedside External records from outside source obtained and reviewed including missed outpatient cardiology visit in March 2024   Lab Tests:  I Ordered, and personally interpreted labs.  The pertinent results include:  Glucose 133. Otherwise normal/negative CBC, BMP, troponin x2   Imaging Studies ordered:  I ordered imaging studies including CXR  I independently visualized and interpreted imaging which showed cardiac enlargement without other acute abnormality I agree with the radiologist interpretation   Cardiac Monitoring:  The patient was maintained on a cardiac monitor.  I personally viewed and  interpreted the cardiac monitored which showed an underlying rhythm of: NSR   Medicines ordered and prescription drug management:  I ordered medication including Toradol for pain  Reevaluation of the patient after these medicines showed that the patient improved I have reviewed the patients home medicines and have made adjustments as needed   Problem List / ED Course:  Patient presents to the emergency department for evaluation of chest pain.  Low suspicion for emergent cardiac etiology given reassuring workup today.  EKG is nonischemic and troponin negative x2.  Patient has a  heart score of 3 consistent with low risk of acute coronary event.  Presenting symptoms are atypical for this.  She reports some improvement to her pain with exertion.  Has also been experiencing recurrent episodes of chest pain similar to tonight's episode.  In light of symptom chronicity, feel that outpatient follow-up is reasonable. Chest x-ray without evidence of mediastinal widening to suggest dissection.  No pneumothorax, pneumonia, pleural effusion.     Reevaluation:  After the interventions noted above, I reevaluated the patient and found that they have :improved   Social Determinants of Health:  Language barrier   Dispostion:  After consideration of the diagnostic results and the patients response to treatment, I feel that the patent would benefit from outpatient cardiology evaluation. No indication for further emergent work up at this time. Return precautions discussed and provided. Patient discharged in stable condition with no unaddressed concerns.          Final Clinical Impression(s) / ED Diagnoses Final diagnoses:  Nonspecific chest pain    Rx / DC Orders ED Discharge Orders          Ordered    Ambulatory referral to Cardiology       Comments: If you have not heard from the Cardiology office within the next 72 hours please call [phone].   08/18/23 0556               Antony Madura, PA-C 08/18/23 8469    Mesner, Barbara Cower, MD 08/18/23 [phone]

## 2023-08-18 NOTE — Discharge Instructions (Addendum)
Your evaluation in the ED today was reassuring and did not reveal a concerning cause of your chest pain.  You have been given a referral to cardiology for follow-up.  In the interim, continue follow-up with your primary care doctor.  You may return for new or concerning symptoms.

## 2023-09-29 ENCOUNTER — Emergency Department (HOSPITAL_COMMUNITY): Payer: Self-pay

## 2023-09-29 ENCOUNTER — Encounter (HOSPITAL_COMMUNITY): Payer: Self-pay

## 2023-09-29 ENCOUNTER — Other Ambulatory Visit: Payer: Self-pay

## 2023-09-29 ENCOUNTER — Emergency Department (HOSPITAL_COMMUNITY)
Admission: EM | Admit: 2023-09-29 | Discharge: 2023-09-30 | Disposition: A | Discharge status: Home / Self Care | Payer: Self-pay | Attending: Emergency Medicine | Admitting: Emergency Medicine

## 2023-09-29 DIAGNOSIS — Y92410 Unspecified street and highway as the place of occurrence of the external cause: Secondary | ICD-10-CM | POA: Insufficient documentation

## 2023-09-29 DIAGNOSIS — M542 Cervicalgia: Secondary | ICD-10-CM | POA: Insufficient documentation

## 2023-09-29 DIAGNOSIS — R519 Headache, unspecified: Secondary | ICD-10-CM | POA: Insufficient documentation

## 2023-09-29 DIAGNOSIS — I10 Essential (primary) hypertension: Secondary | ICD-10-CM | POA: Diagnosis not present

## 2023-09-29 DIAGNOSIS — R0789 Other chest pain: Secondary | ICD-10-CM | POA: Insufficient documentation

## 2023-09-29 DIAGNOSIS — M25532 Pain in left wrist: Secondary | ICD-10-CM | POA: Diagnosis present

## 2023-09-29 DIAGNOSIS — S62357A Nondisplaced fracture of shaft of fifth metacarpal bone, left hand, initial encounter for closed fracture: Secondary | ICD-10-CM | POA: Diagnosis not present

## 2023-09-29 HISTORY — DX: Essential (primary) hypertension: I10

## 2023-09-29 MED ORDER — HYDROCODONE-ACETAMINOPHEN 5-325 MG PO TABS
1.0000 | ORAL_TABLET | Freq: Once | ORAL | Status: AC
  Administered 2023-09-29: 1 via ORAL
  Filled 2023-09-29: qty 1

## 2023-09-30 MED ORDER — OXYCODONE HCL 5 MG PO TABS: 5.0000 mg | ORAL_TABLET | ORAL | 0 refills | Status: DC | PRN

## 2023-10-02 ENCOUNTER — Encounter (HOSPITAL_COMMUNITY): Payer: Self-pay

## 2023-10-10 ENCOUNTER — Ambulatory Visit: Payer: Self-pay | Attending: Internal Medicine | Admitting: Internal Medicine

## 2023-10-10 ENCOUNTER — Encounter: Payer: Self-pay | Admitting: Internal Medicine

## 2023-10-10 VITALS — BP 108/70 | HR 88 | Ht <= 58 in | Wt 163.0 lb

## 2023-10-10 DIAGNOSIS — R072 Precordial pain: Secondary | ICD-10-CM

## 2023-10-10 NOTE — Patient Instructions (Signed)
Medication Instructions:  No changes  *If you need a refill on your cardiac medications before your next appointment, please call your pharmacy*   Lab Work: None    Testing/Procedures: Cardiac Pet stress test How to Prepare for Your Cardiac PET/CT Stress Test:  1. Please do not take these medications before your test:   Medications that may interfere with the cardiac pharmacological stress agent (ex. nitrates - including erectile dysfunction medications, isosorbide mononitrate- [please start to hold this medication the day before the test], tamulosin or beta-blockers) the day of the exam. (Erectile dysfunction medication should be held for at least 72 hrs prior to test) Theophylline containing medications for 12 hours. Dipyridamole 48 hours prior to the test. Your remaining medications may be taken with water.  2. Nothing to eat or drink, except water, 3 hours prior to arrival time.   NO caffeine/decaffeinated products, or chocolate 12 hours prior to arrival.  3. NO perfume, cologne or lotion on chest or abdomen area.          - FEMALES - Please avoid wearing dresses to this appointment.  4. Total time is 1 to 2 hours; you may want to bring reading material for the waiting time.  5. Please report to Radiology at the [facility] Main Entrance 30 minutes early for your test.  [address] [place], [place] [address]  6. Please report to Radiology at [facility] Main Entrance, medical mall, 30 mins prior to your test.  [address]  [place], [place]  [phone]  Diabetic Preparation:  Hold oral medications. You may take NPH and Lantus insulin. Do not take Humalog or Humulin R (Regular Insulin) the day of your test. Check blood sugars prior to leaving the house. If able to eat breakfast prior to 3 hour fasting, you may take all medications, including your insulin, Do not worry if you miss your breakfast dose of insulin - start at your  next meal. Patients who wear a continuous glucose monitor MUST remove the device prior to scanning.  IF YOU THINK YOU MAY BE PREGNANT, OR ARE NURSING PLEASE INFORM THE TECHNOLOGIST.  In preparation for your appointment, medication and supplies will be purchased.  Appointment availability is limited, so if you need to cancel or reschedule, please call the Radiology Department at [phone] [facility]) [place] [phone] [facility])  24 hours in advance to avoid a cancellation fee of $100.00  What to Expect After you Arrive:  Once you arrive and check in for your appointment, you will be taken to a preparation room within the Radiology Department.  A technologist or Nurse will obtain your medical history, verify that you are correctly prepped for the exam, and explain the procedure.  Afterwards,  an IV will be started in your arm and electrodes will be placed on your skin for EKG monitoring during the stress portion of the exam. Then you will be escorted to the PET/CT scanner.  There, staff will get you positioned on the scanner and obtain a blood pressure and EKG.  During the exam, you will continue to be connected to the EKG and blood pressure machines.  A small, safe amount of a radioactive tracer will be injected in your IV to obtain a series of pictures of your heart along with an injection of a stress agent.    After your Exam:  It is recommended that you eat a meal and drink a caffeinated beverage to counter act any effects of  the stress agent.  Drink plenty of fluids for the remainder of the day and urinate frequently for the first couple of hours after the exam.  Your doctor will inform you of your test results within 7-10 business days.  For more information and frequently asked questions, please visit our website : [email or web address]  For questions about your test or how to prepare for your test, please call: Cardiac Imaging Nurse Navigators Office: [phone]     Follow-Up: At [facility], you and your health needs are our priority.  As part of our continuing mission to provide you with exceptional heart care, we have created designated Provider Care Teams.  These Care Teams include your primary Cardiologist (physician) and Advanced Practice Providers (APPs -  Physician Assistants and Nurse Practitioners) who all work together to provide you with the care you need, when you need it.  We recommend signing up for the patient portal called "MyChart".  Sign up information is provided on this After Visit Summary.  MyChart is used to connect with patients for Virtual Visits (Telemedicine).  Patients are able to view lab/test results, encounter notes, upcoming appointments, etc.  Non-urgent messages can be sent to your provider as well.   To learn more about what you can do with MyChart, go to [email or web address].    Your next appointment:   Follow up as needed depending on test results   Provider:   Dr. Carolan Clines If Card or EP not listed click to update   DO NOT delete brackets or number around this link :1}

## 2023-10-10 NOTE — Progress Notes (Signed)
  Cardiology Office Note:  .   Date:  10/10/2023  ID:  Allison Hubbard, DOB [birth year], MRN [id number] PCP: Pcp, No  [facility] Providers Cardiologist:  None    History of Present Illness: .   Allison Hubbard is a 62 y.o. female with history of hypertension who is being referred for chest pain. She saw Dr. Dewitt Rota in March for precordial chest pain.  He recommended a coronary CTA and echocardiogram however I do not see the studies in the system.  She saw Dr. Olga Millers for the same issue in 2017. She had a normal cardiac workup. She was seen in the emergency room in October again for chest pain.  She has reported these issues in the past as I described but has no showed for appointments.  Her ECG showed normal sinus rhythm with no ischemic changes.  Symptoms may be correlated with gastritis.  Her troponin was negative.  She was referred to cardiology again.  She said at the end of September she felt chest pressure. She was not active that day. She cleans 3x per week.  She denies CP with cleaning. She can feel chest pain with anger or sadness. She canceled the appointment prior, because she did could not afford.   No premature CAD hx No smoking No PCP   ROS:  per HPI otherwise negative   Studies Reviewed: .        Per above Risk Assessment/Calculations:      Physical Exam:   VS:   Vitals:   10/10/23 1505  BP: 108/70  Pulse: 88  SpO2: 95%    LMP 07/31/2016    Wt Readings from Last 3 Encounters:  09/29/23 160 lb (72.6 kg)  08/18/23 160 lb (72.6 kg)  03/21/22 165 lb (74.8 kg)    GEN: Well nourished, well developed in no acute distress NECK: No JVD; No carotid bruits CARDIAC: RRR, no murmurs, rubs, gallops RESPIRATORY:  Clear to auscultation without rales, wheezing or rhonchi  ABDOMEN: Soft, non-tender, non-distended EXTREMITIES:  No edema; No deformity   ASSESSMENT AND PLAN: .   Chronic chest pain She has been recommended to get a coronary CTA which  I have not seen. Considering her chronic history of chest pain will recommend a cardiac PET stress scan to assess for epicardial and microvascular disease.  If this is normal she does not need to be seen by cardiologist unless there is evidence of cardiovascular disease.       Dispo: Follow up PRN , if the cardiac PET is abnormal  Signed, Shellye Zandi, Alben Spittle, MD

## 2023-11-16 ENCOUNTER — Ambulatory Visit (HOSPITAL_COMMUNITY): Payer: Self-pay

## 2023-11-16 ENCOUNTER — Ambulatory Visit (HOSPITAL_COMMUNITY)
Admission: EM | Admit: 2023-11-16 | Discharge: 2023-11-16 | Disposition: A | Discharge status: Home / Self Care | Payer: Self-pay | Attending: Physician Assistant | Admitting: Physician Assistant

## 2023-11-16 ENCOUNTER — Encounter (HOSPITAL_COMMUNITY): Payer: Self-pay

## 2023-11-16 DIAGNOSIS — R072 Precordial pain: Secondary | ICD-10-CM

## 2023-11-16 DIAGNOSIS — R0789 Other chest pain: Secondary | ICD-10-CM

## 2023-11-16 MED ORDER — BACLOFEN 10 MG PO TABS
10.0000 mg | ORAL_TABLET | Freq: Every evening | ORAL | 0 refills | Status: AC | PRN
Start: 2023-11-16 — End: ?

## 2023-11-16 MED ORDER — PREDNISONE 10 MG (21) PO TBPK: ORAL_TABLET | ORAL | 0 refills | Status: DC
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
## 2023-11-16 NOTE — Discharge Instructions (Addendum)
 Your EKG was reassuring.  I did not see anything broken or abnormal on your x-ray.  I will contact you if the radiologist sees something else and we need to change our treatment plan.  Start prednisone  as prescribed.  Do not take NSAIDs with this medication including aspirin , ibuprofen /Advil , naproxen/Aleve.  You can use acetaminophen /Tylenol  as needed.  Use heat and gentle stretch to help manage the symptoms.  Start baclofen  at night.  This will make you sleepy so do not drive drink alcohol while taking it.  Follow-up with your primary care next week.  If anything worsens and you have worsening or changing chest pain, shortness of breath, nausea, vomiting, weakness immediately.
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
## 2023-11-27 ENCOUNTER — Telehealth (HOSPITAL_COMMUNITY): Payer: Self-pay | Admitting: *Deleted

## 2023-11-27 NOTE — Telephone Encounter (Signed)
 Reaching out to patient (via Spanish interpreter, Almarie ID [phone]) to offer assistance regarding upcoming cardiac imaging study; pt verbalizes understanding of appt date/time, parking situation and where to check in, pre-test NPO status, and verified current allergies; name and call back number provided for further questions should they arise  Chantal Requena RN Navigator Cardiac Imaging [facility] [phone] office [phone] cell  Patient aware to avoid caffeine 12 hours prior to her cardiac PET scan.

## 2023-11-28 ENCOUNTER — Encounter (HOSPITAL_COMMUNITY)
Admission: RE | Admit: 2023-11-28 | Discharge: 2023-11-28 | Disposition: A | Discharge status: Home / Self Care | Payer: Self-pay | Source: Ambulatory Visit | Attending: Internal Medicine | Admitting: Internal Medicine

## 2023-11-28 DIAGNOSIS — R072 Precordial pain: Secondary | ICD-10-CM | POA: Insufficient documentation

## 2023-11-28 LAB — NM PET CT CARDIAC PERFUSION MULTI W/ABSOLUTE BLOODFLOW
LV dias vol: 67 mL (ref 46–106)
LV sys vol: 24 mL
MBFR: 2.75
Nuc Rest EF: 64 %
Nuc Stress EF: 67 %
Peak HR: 88 {beats}/min
Rest HR: 50 {beats}/min
Rest MBF: 0.8 ml/g/min
Rest Nuclear Isotope Dose: 19.1 mCi
Rest perfusion cavity size (mL): 67 mL
ST Depression (mm): 0 mm
Stress MBF: 2.2 ml/g/min
Stress Nuclear Isotope Dose: 19.1 mCi
Stress perfusion cavity size (mL): 73 mL

## 2023-11-28 MED ORDER — REGADENOSON 0.4 MG/5ML IV SOLN
0.4000 mg | Freq: Once | INTRAVENOUS | Status: AC
  Administered 2023-11-28: 0.4 mg via INTRAVENOUS

## 2023-11-28 MED ORDER — REGADENOSON 0.4 MG/5ML IV SOLN
INTRAVENOUS | Status: AC
  Filled 2023-11-28: qty 5

## 2023-11-28 MED ORDER — RUBIDIUM RB82 GENERATOR (RUBYFILL)
19.1000 | PACK | Freq: Once | INTRAVENOUS | Status: AC
  Administered 2023-11-28: 19.1 via INTRAVENOUS

## 2023-11-28 MED ORDER — RUBIDIUM RB82 GENERATOR (RUBYFILL)
19.0000 | PACK | Freq: Once | INTRAVENOUS | Status: AC
  Administered 2023-11-28: 19 via INTRAVENOUS

## 2023-12-05 ENCOUNTER — Other Ambulatory Visit: Payer: Self-pay

## 2023-12-05 MED ORDER — ATORVASTATIN CALCIUM 20 MG PO TABS
20.0000 mg | ORAL_TABLET | Freq: Every day | ORAL | 3 refills | Status: AC
Start: 2023-12-05 — End: 2024-03-04

## 2024-02-06 NOTE — Progress Notes (Deleted)
   Cardiology Office Note:    Date:  02/06/2024   ID:  Allison Hubbard, DOB 18-Sep-1961, MRN [id number]  PCP:  Pcp, No  Cardiologist:  None { Click to update primary MD,subspecialty MD or APP then REFRESH:1}    Referring MD: No ref. provider found   Chief Complaint: follow-up of chest pain  History of Present Illness:    Allison Hubbard is a 63 y.o. female with a history of chest pain with negative cardiac PET stress test in 11/2023, hypertension, hyperlipidemia, and depression and presents today for follow-up of chest pain.  Patient has a history of intermittent chest pain. Coronary CTA was previously ordered at visit in 01/2023 for further evaluation of this but was never completed. She was last seen by Dr. Carolan Clines in 09/2023 after a recent ED visit for chest pain. EKG and high-sensitivity were negative in the ED. Cardiac PET stress test was ordered and was low risk with no evidence of ischemia. Mild coronary calcifications were noted in the distribution of the LAD.  She presents today for follow-up. ***  Chest Pain Coronary Artery Calcifications Patient has a history of intermittent chest pain. Recent cardiac PET stress test in 11/2023 was low risk with no evidence of ischemia. Mild coronary calcifications were noted in the distribution of the LAD. - No recurrent chest pain.  - Continue statin.  Hypertension BP well controlled.  - Continue Lisinopril 20mg  daily.   Hyperlipidemia Lipid panel in ***: - Continue Lipitor 20mg  daily.   EKGs/Labs/Other Studies Reviewed:    The following studies were reviewed:  Cardiac PET Stress Test 11/28/2023:   The study is normal. The study is low risk.   LV perfusion is normal.   Rest left ventricular function is normal. Rest EF: 64%. Stress left ventricular function is normal. Stress EF: 67%. End diastolic cavity size is normal. End systolic cavity size is normal.   Myocardial blood flow was computed to be 0.51ml/g/min at rest and  2.55ml/g/min at stress. Global myocardial blood flow reserve was 2.75 and was normal.   Coronary calcium was present on the attenuation correction CT images. Mild coronary calcifications were present. Coronary calcifications were present in the left anterior descending artery distribution(s).  EKG:  EKG not ordered today.   Recent Labs: 08/18/2023: BUN 14; Creatinine, Ser 0.86; Hemoglobin 12.6; Platelets 203; Potassium 4.0; Sodium 138  Recent Lipid Panel No results found for: "CHOL", "TRIG", "HDL", "CHOLHDL", "VLDL", "LDLCALC", "LDLDIRECT"  Physical Exam:    Vital Signs: LMP 07/31/2016     Wt Readings from Last 3 Encounters:  11/16/23 162 lb (73.5 kg)  10/10/23 163 lb (73.9 kg)  09/29/23 160 lb (72.6 kg)     General: 63 y.o. female in no acute distress. HEENT: Normocephalic and atraumatic. Sclera clear.  Neck: Supple. No carotid bruits. No JVD. Heart: *** RRR. Distinct S1 and S2. No murmurs, gallops, or rubs.  Lungs: No increased work of breathing. Clear to ausculation bilaterally. No wheezes, rhonchi, or rales.  Abdomen: Soft, non-distended, and non-tender to palpation.  Extremities: No lower extremity edema.  Radial and distal pedal pulses 2+ and equal bilaterally. Skin: Warm and dry. Neuro: No focal deficits. Psych: Normal affect. Responds appropriately.   Assessment:    No diagnosis found.  Plan:     Disposition: Follow up in ***   Signed, Corrin Parker, PA-C  02/06/2024 3:56 PM     [facility]

## 2024-02-19 ENCOUNTER — Ambulatory Visit: Payer: Self-pay | Attending: Student | Admitting: Student

## 2024-02-20 ENCOUNTER — Encounter: Payer: Self-pay | Admitting: Student

## 2024-06-28 ENCOUNTER — Encounter (HOSPITAL_COMMUNITY): Payer: Self-pay

## 2024-06-28 ENCOUNTER — Emergency Department (HOSPITAL_COMMUNITY)
Admission: EM | Admit: 2024-06-28 | Discharge: 2024-06-29 | Disposition: A | Discharge status: Home / Self Care | Payer: Self-pay | Attending: Emergency Medicine | Admitting: Emergency Medicine

## 2024-06-28 ENCOUNTER — Other Ambulatory Visit: Payer: Self-pay

## 2024-06-28 ENCOUNTER — Ambulatory Visit (HOSPITAL_COMMUNITY)
Admission: EM | Admit: 2024-06-28 | Discharge: 2024-06-28 | Disposition: A | Discharge status: Home / Self Care | Payer: Self-pay | Attending: Family Medicine | Admitting: Family Medicine

## 2024-06-28 ENCOUNTER — Emergency Department (HOSPITAL_COMMUNITY): Payer: Self-pay

## 2024-06-28 DIAGNOSIS — I10 Essential (primary) hypertension: Secondary | ICD-10-CM | POA: Insufficient documentation

## 2024-06-28 DIAGNOSIS — R0789 Other chest pain: Secondary | ICD-10-CM | POA: Insufficient documentation

## 2024-06-28 DIAGNOSIS — R072 Precordial pain: Secondary | ICD-10-CM

## 2024-06-28 DIAGNOSIS — Z79899 Other long term (current) drug therapy: Secondary | ICD-10-CM | POA: Insufficient documentation

## 2024-06-28 LAB — BASIC METABOLIC PANEL WITH GFR
Anion gap: 11 (ref 5–15)
BUN: 13 mg/dL (ref 8–23)
CO2: 22 mmol/L (ref 22–32)
Calcium: 9.6 mg/dL (ref 8.9–10.3)
Chloride: 103 mmol/L (ref 98–111)
Creatinine, Ser: 0.7 mg/dL (ref 0.44–1.00)
GFR, Estimated: >60 mL/min (ref >60)
Glucose, Bld: 109 mg/dL — ABNORMAL HIGH (ref 70–99)
Potassium: 3.8 mmol/L (ref 3.5–5.1)
Sodium: 136 mmol/L (ref 135–145)

## 2024-06-28 LAB — CBC
HCT: 41.5 % (ref 36.0–46.0)
Hemoglobin: 13.7 g/dL (ref 12.0–15.0)
MCH: 28.7 pg (ref 26.0–34.0)
MCHC: 33 g/dL (ref 30.0–36.0)
MCV: 86.8 fL (ref 80.0–100.0)
Platelets: 210 K/uL (ref 150–400)
RBC: 4.78 MIL/uL (ref 3.87–5.11)
RDW: 13.2 % (ref 11.5–15.5)
WBC: 7.8 K/uL (ref 4.0–10.5)
nRBC: 0 % (ref 0.0–0.2)

## 2024-06-28 LAB — TROPONIN I (HIGH SENSITIVITY)
Troponin I (High Sensitivity): 4 ng/L (ref <18)
Troponin I (High Sensitivity): 3 ng/L (ref <18)

## 2024-06-28 MED ORDER — OXYCODONE-ACETAMINOPHEN 5-325 MG PO TABS
1.0000 | ORAL_TABLET | Freq: Once | ORAL | Status: AC
  Administered 2024-06-28: 1 via ORAL
  Filled 2024-06-28: qty 1

## 2024-06-28 NOTE — ED Provider Notes (Signed)
 [facility]    CSN: [id number] Arrival date & time: 06/28/24  1603      History   Chief Complaint Chief Complaint  Patient presents with   Chest Pain   Medication Refill    HPI Allison Hubbard is a 63 y.o. female.    Chest Pain Medication Refill   Here for chest pain.  It has been going on since yesterday.  At times it has been more severe like a 7 or 8 out of 10, but now it is a 5 out of 10.  She has been out of her lisinopril 20 mg for about 4 weeks.  She does not have a local medical provider as the clinic she was attending has closed.  No diaphoresis or palpitations, but she has had some nausea off and on.  No shortness of breath.  NKDA    Past Medical History:  Diagnosis Date   Complication of anesthesia 1994 may   had feeling with c section surgery   Depression    7 years ago. no problems now.   Gastritis    Hypertension    Postmenopausal bleeding     Patient Active Problem List   Diagnosis Date Noted   Essential hypertension 12/01/2015   Chest pain 12/01/2015   Perimenopause 07/28/2014    Past Surgical History:  Procedure Laterality Date   CESAREAN SECTION     CESAREAN SECTION     DILATATION & CURETTAGE/HYSTEROSCOPY WITH MYOSURE N/A 11/01/2019   Procedure: DILATATION & CURETTAGE/HYSTEROSCOPY WITH MYOSURE;  Surgeon: Lavoie, Marie-Lyne, MD;  Location: [facility];  Service: Gynecology;  Laterality: N/A;  requesting 7:30am OR start in [place] Gyn block requests 30 minutes OR time.    OB History     Gravida  1   Para  1   Term  0   Preterm  0   AB  0   Living         SAB  0   IAB  0   Ectopic  0   Multiple      Live Births               Home Medications    Prior to Admission medications   Medication Sig Start Date End Date Taking? Authorizing Provider  Aloe Vera 25 MG CAPS Take by mouth.    [provider]  Ascorbic Acid (VITAMIN C) 100 MG tablet Take 100 mg by mouth  daily.    [provider]  atorvastatin (LIPITOR) 20 MG tablet Take 1 tablet (20 mg total) by mouth daily. 12/05/23 03/04/24  Alvan Ronal BRAVO, MD  baclofen (LIORESAL) 10 MG tablet Take 1 tablet (10 mg total) by mouth at bedtime as needed for muscle spasms. 11/16/23   Raspet, Erin K, PA-C  Bee Pollen 1000 MG TABS Take by mouth.    [provider]  lisinopril (ZESTRIL) 20 MG tablet Take 20 mg by mouth daily.    [provider]  Omega-3 Fatty Acids (FISH OIL) 1000 MG CAPS Take 1 capsule by mouth daily.     [provider]    Family History Family History  Problem Relation Age of Onset   Diabetes Mother    Diabetes Sister     Social History Social History   Tobacco Use   Smoking status: Never   Smokeless tobacco: Never  Vaping Use   Vaping status: Never Used  Substance Use Topics   Alcohol use: Yes  Comment: sometimes   Drug use: No     Allergies   Patient has no known allergies.   Review of Systems Review of Systems  Cardiovascular:  Positive for chest pain.     Physical Exam Triage Vital Signs ED Triage Vitals  Encounter Vitals Group     BP 06/28/24 1623 (!) 144/88     Girls Systolic BP Percentile --      Girls Diastolic BP Percentile --      Boys Systolic BP Percentile --      Boys Diastolic BP Percentile --      Pulse Rate 06/28/24 1623 67     Resp 06/28/24 1623 16     Temp 06/28/24 1623 98 F (36.7 C)     Temp Source 06/28/24 1623 Oral     SpO2 06/28/24 1623 97 %     Weight --      Height --      Head Circumference --      Peak Flow --      Pain Score 06/28/24 1622 8     Pain Loc --      Pain Education --      Exclude from Growth Chart --    No data found.  Updated Vital Signs BP (!) 144/88 (BP Location: Right Arm)   Pulse 67   Temp 98 F (36.7 C) (Oral)   Resp 16   LMP 07/31/2016   SpO2 97%   Visual Acuity Right Eye Distance:   Left Eye Distance:   Bilateral Distance:    Right Eye Near:   Left Eye  Near:    Bilateral Near:     Physical Exam Vitals reviewed.  Constitutional:      General: She is not in acute distress.    Appearance: She is not toxic-appearing.     Comments: There is possibly a mild bit of diaphoresis.  HENT:     Mouth/Throat:     Mouth: Mucous membranes are moist.  Eyes:     Extraocular Movements: Extraocular movements intact.     Conjunctiva/sclera: Conjunctivae normal.     Pupils: Pupils are equal, round, and reactive to light.  Cardiovascular:     Rate and Rhythm: Normal rate and regular rhythm.     Heart sounds: No murmur heard. Pulmonary:     Breath sounds: Normal breath sounds.  Musculoskeletal:     Cervical back: Neck supple.  Skin:    Coloration: Skin is not pale.  Neurological:     General: No focal deficit present.     Mental Status: She is alert and oriented to person, place, and time.  Psychiatric:        Behavior: Behavior normal.      UC Treatments / Results  Labs (all labs ordered are listed, but only abnormal results are displayed) Labs Reviewed - No data to display  EKG   Radiology No results found.  Procedures Procedures (including critical care time)  Medications Ordered in UC Medications - No data to display  Initial Impression / Assessment and Plan / UC Course  I have reviewed the triage vital signs and the nursing notes.  Pertinent labs & imaging results that were available during my care of the patient were reviewed by me and considered in my medical decision making (see chart for details).   EKG is reassuring, but she continues to have chest pain.  Blood pressure is only mildly elevated here at 144/88.  I have asked her to  proceed to the emergency room for further evaluation and treatment that we cannot provide here in urgent care setting.  She is agreeable and will go by private car. Final Clinical Impressions(s) / UC Diagnoses   Final diagnoses:  Precordial pain     Discharge Instructions      Patient  will proceed to the emergency room for further evaluation for her chest pain.       ED Prescriptions   None    PDMP not reviewed this encounter.   Vonna Sharlet POUR, MD 06/28/24 1726

## 2024-06-28 NOTE — ED Triage Notes (Signed)
 Pt reports left sided chest pressure since yesterday. 7/10. Denies n/v. Denies SHOB.

## 2024-06-28 NOTE — Discharge Instructions (Signed)
 Patient will proceed to the emergency room for further evaluation for her chest pain.

## 2024-06-28 NOTE — ED Triage Notes (Signed)
 Patient c/o intermittent chest pressure since yesterday afternoon. Patient denies SOB.  Patient states she took Aspirin  this AM.  Patient states she has not taken her BP medication x 4 weeks. Patient states she was going to a clinic on [address] and the clinic closed and is unable to get the medication. Patieatn was taking Lisinopril

## 2024-06-29 ENCOUNTER — Telehealth: Payer: Self-pay

## 2024-06-29 NOTE — ED Provider Notes (Signed)
 [place] [facility]MENT AT [facility] Provider Note   CSN: [id number] Arrival date & time: 06/28/24  1732     Patient presents with: Chest Pain   Allison Hubbard is a 63 y.o. female with a history of hypertension, gastritis, depression, chest wall pain.  Patient presents to ED for evaluation of chest pain.  Reports that this morning around 0900 she noted some left-sided chest wall pain worse with movement, breathing.  She reports that when she takes deep breaths, this will worsen her chest pain and she becomes short of breath but denies that she is short of breath when she is not taking deep breaths.  She denies leg swelling, nausea, vomiting, abdominal pain, lightheadedness, dizziness, weakness.  She was seen at urgent care and redirected to ED for further testing and imaging.  Chart was reviewed and the patient does have a history of chest wall pain.  Patient follows with cardiology recently had reassuring PET/CT cardiac perfusion testing.  She reports that she was hypertensive on arrival but reports that she has not been with her blood pressure medication for 4 weeks.  This was refilled at urgent care.  She denies headache, blurred vision.  She was given oxycodone  in triage for chest pain and states that this did resolve her chest pain.    Chest Pain      Prior to Admission medications   Medication Sig Start Date End Date Taking? Authorizing Provider  Aloe Vera 25 MG CAPS Take by mouth.    [provider]  Ascorbic Acid (VITAMIN C) 100 MG tablet Take 100 mg by mouth daily.    [provider]  atorvastatin  (LIPITOR) 20 MG tablet Take 1 tablet (20 mg total) by mouth daily. 12/05/23 03/04/24  Alvan Ronal BRAVO, MD  baclofen  (LIORESAL ) 10 MG tablet Take 1 tablet (10 mg total) by mouth at bedtime as needed for muscle spasms. 11/16/23   Raspet, Erin K, PA-C  Bee Pollen 1000 MG TABS Take by mouth.    [provider]  lisinopril (ZESTRIL) 20 MG tablet  Take 20 mg by mouth daily.    [provider]  Omega-3 Fatty Acids (FISH OIL) 1000 MG CAPS Take 1 capsule by mouth daily.     [provider]    Allergies: Patient has no known allergies.    Review of Systems  Cardiovascular:  Positive for chest pain.  All other systems reviewed and are negative.   Updated Vital Signs BP 135/75 (BP Location: Left Arm)   Pulse (!) 56   Temp 97.7 F (36.5 C)   Resp 16   Ht 4' 8 (1.422 m)   Wt 73.5 kg   LMP 07/31/2016   SpO2 96%   BMI 36.32 kg/m   Physical Exam Vitals and nursing note reviewed.  Constitutional:      General: She is not in acute distress.    Appearance: She is well-developed.  HENT:     Head: Normocephalic and atraumatic.  Eyes:     Conjunctiva/sclera: Conjunctivae normal.  Cardiovascular:     Rate and Rhythm: Normal rate and regular rhythm.     Heart sounds: No murmur heard. Pulmonary:     Effort: Pulmonary effort is normal. No respiratory distress.     Breath sounds: Normal breath sounds.  Abdominal:     Palpations: Abdomen is soft.     Tenderness: There is no abdominal tenderness.  Musculoskeletal:        General: No swelling.  Cervical back: Neck supple.  Skin:    General: Skin is warm and dry.     Capillary Refill: Capillary refill takes less than 2 seconds.  Neurological:     Mental Status: She is alert and oriented to person, place, and time. Mental status is at baseline.  Psychiatric:        Mood and Affect: Mood normal.     (all labs ordered are listed, but only abnormal results are displayed) Labs Reviewed  BASIC METABOLIC PANEL WITH GFR - Abnormal; Notable for the following components:      Result Value   Glucose, Bld 109 (*)    All other components within normal limits  CBC  TROPONIN I (HIGH SENSITIVITY)  TROPONIN I (HIGH SENSITIVITY)    EKG: None  Radiology: DG Chest 2 View Result Date: 06/28/2024 CLINICAL DATA:  Chest pressure EXAM: CHEST - 2 VIEW COMPARISON:  None  Available. FINDINGS: Normal mediastinum and cardiac silhouette. Normal pulmonary vasculature. No evidence of effusion, infiltrate, or pneumothorax. No acute bony abnormality. IMPRESSION: No acute cardiopulmonary process. Electronically Signed   By: Jackquline Boxer M.D.   On: 06/28/2024 20:09    Procedures   Medications Ordered in the ED  oxyCODONE -acetaminophen  (PERCOCET/ROXICET) 5-325 MG per tablet 1 tablet (1 tablet Oral Given 06/28/24 2047)    Medical Decision Making Amount and/or Complexity of Data Reviewed Labs: ordered. Radiology: ordered.   This is a 63 year old female who presents with chest pain.  Chart was reviewed, patient has history of chronic chest pain per cardiology note.  She follows with Dr. Alvan.  Was seen at urgent care and redirected to ED for further care and imaging, laboratory tests.  On examination, the patient is afebrile and nontachycardic.  Her lung sounds are clear bilaterally, she is not hypoxic.  Abdomen is soft and compressible.  Neuroexam at baseline.  Blood pressure 135/75.  Overall nontoxic in appearance with reassuring vital signs.  Labs initiated in triage include CBC, BMP, troponin x 2, EKG and chest x-ray.  Patient was given oxycodone .  CBC without leukocytosis or anemia.  Metabolic panel unremarkable.  Troponins are flat 4, 3.  EKG is nonischemic.  Chest x-ray unremarkable.  At this time, patient workup unremarkable.  Advised to follow-up with cardiology team.  Will also refer patient to PCP for continued care.  Return precautions provided and patient voiced understanding.  Stable to discharge home.   Final diagnoses:  Chest wall pain    ED Discharge Orders     None          Ruthell Lonni JULIANNA DEVONNA 06/29/24 0247    Griselda Norris, MD 06/29/24 [phone]

## 2024-06-29 NOTE — ED Notes (Signed)
 Pt discharged. Pt in NAD at this time. Pt given discharge papers and papers explained

## 2024-06-29 NOTE — Telephone Encounter (Signed)
 Patient was supposed to get a script for her blood pressure medication, lisinipril 10 mg. Messaged with C Groce, who said it should have been prescribed by urgent care . No record of this, calle din as ordered Lisinipril 10 mg one tablet daily quantity 30 no refills. She will f/u with cardiology

## 2024-06-29 NOTE — Discharge Instructions (Signed)
 It was a pleasure taking part in your care.  As we discussed, your workup here tonight is reassuring.  There is no evidence of stress on your heart.  Please follow-up with your cardiology team, Dr. Alvan, for further care.  Please pick up blood pressure medication and begin taking this as prescribed.  Return to the ED with any new or worsening symptoms.  Follow-up with [facility] to establish care with a PCP.  Please call Monday make an appointment.
